# Patient Record
Sex: Male | Born: 1940
Health system: Southern US, Community
[De-identification: ages and names within clinical notes are randomized; demographics above are authoritative.]

## PROBLEM LIST (undated history)

## (undated) DIAGNOSIS — F419 Anxiety disorder, unspecified: Secondary | ICD-10-CM

## (undated) DIAGNOSIS — M4800 Spinal stenosis, site unspecified: Secondary | ICD-10-CM

## (undated) DIAGNOSIS — I10 Essential (primary) hypertension: Secondary | ICD-10-CM

## (undated) DIAGNOSIS — E785 Hyperlipidemia, unspecified: Secondary | ICD-10-CM

## (undated) DIAGNOSIS — C61 Malignant neoplasm of prostate: Secondary | ICD-10-CM

## (undated) DIAGNOSIS — I480 Paroxysmal atrial fibrillation: Secondary | ICD-10-CM

## (undated) DIAGNOSIS — I639 Cerebral infarction, unspecified: Secondary | ICD-10-CM

## (undated) DIAGNOSIS — K219 Gastro-esophageal reflux disease without esophagitis: Secondary | ICD-10-CM

## (undated) DIAGNOSIS — Z8739 Personal history of other diseases of the musculoskeletal system and connective tissue: Secondary | ICD-10-CM

## (undated) DIAGNOSIS — K635 Polyp of colon: Secondary | ICD-10-CM

## (undated) HISTORY — PX: OTHER SURGICAL HISTORY: SHX169

## (undated) HISTORY — DX: Hyperlipidemia, unspecified: E78.5

## (undated) HISTORY — DX: Cerebral infarction, unspecified: I63.9

## (undated) HISTORY — DX: Spinal stenosis, site unspecified: M48.00

## (undated) HISTORY — DX: Malignant neoplasm of prostate: C61

## (undated) HISTORY — DX: Essential (primary) hypertension: I10

## (undated) HISTORY — DX: Paroxysmal atrial fibrillation: I48.0

## (undated) HISTORY — PX: COLONOSCOPY: SHX174

## (undated) HISTORY — DX: Gastro-esophageal reflux disease without esophagitis: K21.9

---

## 1993-06-17 ENCOUNTER — Encounter: Payer: Self-pay | Admitting: Cardiology

## 2000-05-28 ENCOUNTER — Encounter: Payer: Self-pay | Admitting: *Deleted

## 2000-05-31 ENCOUNTER — Inpatient Hospital Stay (HOSPITAL_COMMUNITY): Admission: RE | Admit: 2000-05-31 | Discharge: 2000-06-04 | Payer: Self-pay | Admitting: *Deleted

## 2001-04-05 ENCOUNTER — Inpatient Hospital Stay (HOSPITAL_COMMUNITY): Admission: RE | Admit: 2001-04-05 | Discharge: 2001-04-08 | Payer: Self-pay | Admitting: *Deleted

## 2004-05-27 ENCOUNTER — Encounter: Payer: Self-pay | Admitting: Cardiology

## 2005-08-10 ENCOUNTER — Encounter (HOSPITAL_COMMUNITY): Admission: RE | Admit: 2005-08-10 | Discharge: 2005-10-10 | Payer: Self-pay | Admitting: Urology

## 2005-09-08 ENCOUNTER — Ambulatory Visit: Payer: Self-pay | Admitting: *Deleted

## 2005-09-08 ENCOUNTER — Encounter: Payer: Self-pay | Admitting: Cardiology

## 2005-10-16 HISTORY — PX: PROSTATE SURGERY: SHX751

## 2005-10-23 ENCOUNTER — Encounter (INDEPENDENT_AMBULATORY_CARE_PROVIDER_SITE_OTHER): Payer: Self-pay | Admitting: *Deleted

## 2005-10-23 ENCOUNTER — Inpatient Hospital Stay (HOSPITAL_COMMUNITY): Admission: RE | Admit: 2005-10-23 | Discharge: 2005-10-24 | Payer: Self-pay | Admitting: Urology

## 2009-04-12 ENCOUNTER — Encounter: Payer: Self-pay | Admitting: Cardiology

## 2009-09-01 ENCOUNTER — Encounter: Payer: Self-pay | Admitting: Cardiology

## 2009-09-06 ENCOUNTER — Ambulatory Visit (HOSPITAL_COMMUNITY): Admission: RE | Admit: 2009-09-06 | Discharge: 2009-09-07 | Payer: Self-pay | Admitting: Neurosurgery

## 2009-09-17 ENCOUNTER — Encounter: Payer: Self-pay | Admitting: Cardiology

## 2009-09-17 ENCOUNTER — Ambulatory Visit: Payer: Self-pay | Admitting: Cardiology

## 2009-09-17 ENCOUNTER — Encounter: Payer: Self-pay | Admitting: Physician Assistant

## 2009-09-18 ENCOUNTER — Encounter: Payer: Self-pay | Admitting: Cardiology

## 2009-09-20 ENCOUNTER — Encounter: Payer: Self-pay | Admitting: Cardiology

## 2009-09-21 ENCOUNTER — Encounter: Payer: Self-pay | Admitting: Cardiology

## 2009-10-27 DIAGNOSIS — I480 Paroxysmal atrial fibrillation: Secondary | ICD-10-CM

## 2009-10-28 ENCOUNTER — Encounter: Payer: Self-pay | Admitting: Physician Assistant

## 2009-10-28 ENCOUNTER — Ambulatory Visit: Payer: Self-pay | Admitting: Cardiology

## 2009-10-28 DIAGNOSIS — E782 Mixed hyperlipidemia: Secondary | ICD-10-CM

## 2010-02-01 ENCOUNTER — Encounter: Payer: Self-pay | Admitting: Cardiology

## 2010-04-28 ENCOUNTER — Ambulatory Visit: Payer: Self-pay | Admitting: Cardiology

## 2010-04-28 DIAGNOSIS — I10 Essential (primary) hypertension: Secondary | ICD-10-CM

## 2010-05-26 ENCOUNTER — Encounter: Payer: Self-pay | Admitting: Cardiology

## 2010-11-01 ENCOUNTER — Ambulatory Visit: Admit: 2010-11-01 | Payer: Self-pay | Admitting: Cardiology

## 2010-11-17 NOTE — Assessment & Plan Note (Signed)
Summary: 6 mo fu per july   Visit Type:  Follow-up Primary Provider:  Dr. Doreen Beam   History of Present Illness: 70 year old male presents for followup. He reports doing well, indicating better energy since his last visit, no palpitations, and no exertional chest pain. He continues to work in a [employer].  Followup labs from April 2011 showed BUN 10, creatinine 0.8, AST 14, ALT 16, cholesterol 118, HDL 45, LDL 52, triglycerides 107, potassium 4.2, TSH 1.0, hemoglobin 15.3, platelets 239.  We discussed the recent FDA recommendations regarding simvastatin dosing.  He denies any bleeding problems on Coumadin.  I reviewed his cardiac testing including echocardiogram from 2010, and prior Cardiolite from 2005.  Preventive Screening-Counseling & Management  Alcohol-Tobacco     Smoking Status: never  Current Medications (verified): 1)  Cardizem Cd 240 Mg Xr24h-Cap (Diltiazem Hcl Coated Beads) .... Take 1 Tablet By Mouth Once A Day 2)  Vasotec 20 Mg Tabs (Enalapril Maleate) .... Take 1 Tablet By Mouth Two Times A Day 3)  Multivitamins  Tabs (Multiple Vitamin) .... Take 1 Tablet By Mouth Once A Day 4)  Gabapentin 300 Mg Caps (Gabapentin) .... Take 1 Tablet By Mouth Two Times A Day 5)  Metformin Hcl 500 Mg Tabs (Metformin Hcl) .... Take 1 Tablet By Mouth Two Times A Day 6)  Simvastatin 40 Mg Tabs (Simvastatin) .... Take 1 Tablet By Mouth Once A Day 7)  Coumadin 2.5 Mg Tabs (Warfarin Sodium) .... Use As Directed 8)  Fish Oil 1000 Mg Caps (Omega-3 Fatty Acids) .... Take 2 Tablet By Mouth Once A Day 9)  Zolpidem Tartrate 10 Mg Tabs (Zolpidem Tartrate) .... Take 1 Tablet By Mouth Once A Day At Bedtime  Allergies (verified): No Known Drug Allergies  Comments:  Nurse/Medical Assistant: The patient's medication list and allergies were reviewed with the patient and were updated in the Medication and Allergy Lists.  Past History:  Social History: Last updated:  04/28/2010 Tobacco Use - No.  Alcohol Use - yes  Past Medical History: Atrial Fibrillation - paroxysmal Cerebrovascular Disease - stroke in 1994 G E R D Hyperlipidemia Hypertension Spinal stenosis Prostate cancer Diabetes Type 2  Past Surgical History: Bilateral total hip replacements Bilateral decompressive laminectomies and foraminotomies L4-L5  Family History: Brother age 63 status post MI and bypass surgery  Social History: Tobacco Use - No.  Alcohol Use - yes  Review of Systems  The patient denies anorexia, fever, weight loss, chest pain, syncope, dyspnea on exertion, peripheral edema, hemoptysis, melena, hematochezia, and severe indigestion/heartburn.         Otherwise reviewed and negative.  Vital Signs:  Patient profile:   70 year old male Height:      71 inches Weight:      194 pounds Pulse rate:   94 / minute BP sitting:   103 / 75  (left arm) Cuff size:   large  Vitals Entered By: Carlye Grippe (April 28, 2010 9:56 AM)  Physical Exam  Additional Exam:  GENERAL:  70 year old male, in no distress. HEENT: [place], AT. PERRLA, EOMI. NECK: Palpable carotid pulses, no bruits; no JVD; no thyromegaly. LUNGS: CTA bilaterally. CARDIAC: Irregularly irregular. No significant murmurs. No rubs, gallops. ABDOMEN: Soft, nontender. Intact BS. EXTREMETIES: no significant edema. MUSCULOSKELETAL: No obvious deformity. SKIN: warm, dry. no obvious rashes. NEURO: Alert and oriented. No focal deficit.    Echocardiogram  Procedure date:  09/20/2009  Findings:      Mild LVH with LVEF 55-60%. Mild  RAE, aortic annular calcification with no aortic stenosis, mitral annular calcification with mild MR, trace TR, mild pulmonary hypertension.  Nuclear Study  Procedure date:  05/23/2004  Findings:      Adenosine Cardiolite without diagnostic ST segment changes. Fixed anteroapical and inferoapical defects with normal wall motion and LVEF 55%. No ischemia.  EKG  Procedure  date:  04/28/2010  Findings:      Atrial fibrillation at 88 beats per minute, nonspecific ST changes.  Impression & Recommendations:  Problem # 1:  ATRIAL FIBRILLATION (ICD-427.31)  Paroxysmal to persistent atrial fibrillation. Symptomatically Mr. Broyles is stable without any significant palpitations. He continues on Cardizem CD and Coumadin. Noted to be in atrial fibrillation today, asymptomatic. Continue medical therapy and observation. Office visit in 6 months.  His updated medication list for this problem includes:    Coumadin 2.5 Mg Tabs (Warfarin sodium) ..... Use as directed  Orders: EKG w/ Interpretation (93000)  Problem # 2:  DYSLIPIDEMIA (ICD-272.4)  Followed by Dr. Sherril Croon with favorable LDL of 52 and normal liver function tests back in April. Based on most recent FDA recommendations, simvastatin should be decreased to 10 mg daily in light of concurrent therapy with Cardizem CD. I asked him to discuss this with Dr. Sherril Croon. Following this change, would suggest a repeat lipid profile and liver function tests over 3 months. Need to ensure that LDL is still well controlled on the reduced dose, otherwise he will need a different medication.  His updated medication list for this problem includes:    Simvastatin 40 Mg Tabs (Simvastatin) .Marland Kitchen... Take 1 tablet by mouth once a day  Problem # 3:  DM (ICD-250.00)  Followed by Dr. Sherril Croon.  His updated medication list for this problem includes:    Vasotec 20 Mg Tabs (Enalapril maleate) .Marland Kitchen... Take 1 tablet by mouth two times a day    Metformin Hcl 500 Mg Tabs (Metformin hcl) .Marland Kitchen... Take 1 tablet by mouth two times a day  Problem # 4:  ESSENTIAL HYPERTENSION, BENIGN (ICD-401.1)  Blood pressure well controlled today.  His updated medication list for this problem includes:    Cardizem Cd 240 Mg Xr24h-cap (Diltiazem hcl coated beads) .Marland Kitchen... Take 1 tablet by mouth once a day    Vasotec 20 Mg Tabs (Enalapril maleate) .Marland Kitchen... Take 1 tablet by mouth  two times a day  His updated medication list for this problem includes:    Cardizem Cd 240 Mg Xr24h-cap (Diltiazem hcl coated beads) .Marland Kitchen... Take 1 tablet by mouth once a day    Vasotec 20 Mg Tabs (Enalapril maleate) .Marland Kitchen... Take 1 tablet by mouth two times a day  Patient Instructions: 1)  Your physician wants you to follow-up in: 6 months. You will receive a reminder letter in the mail one-two months in advance. If you don't receive a letter, please call our office to schedule the follow-up appointment. 2)  Discuss Simvastatin and diltiazem doses with Dr. Sherril Croon.

## 2010-11-17 NOTE — Assessment & Plan Note (Signed)
Summary: old est pat. 2006   Visit Type:  hospital follow-up Primary Provider:  Doreen Beam, MD   History of Present Illness: 70 -year-old male, with previous history of PAF, recently referred to [place] here at [facility] for evaluation of paroxysmal atrial fibrillation with RVR, in the setting of urosepsis. We assisted in management of rate control, with increase in home dose of diltiazem. 2-D echo was obtained, indicating normal LVF with no focal motion abnormalities, mild mitral regurgitation. Of note, the patient has no known history of CAD,  has numerous cardiac risk factors, including diabetes mellitus. Also, he presented on chronic Coumadin, which continues to be followed by Dr. Sherril Croon.  Since being discharged in December, patient denies any development of tachycardia palpitations, chest pain, or shortness of breath.  He has followed up with Dr. Sherril Croon, including monitoring of his protimes, and he reports compliance with his medications.  A 12-lead EKG in our office today indicates maintenance of NSR.   Preventive Screening-Counseling & Management  Alcohol-Tobacco     Smoking Status: never  Current Medications (verified): 1)  Cardizem Cd 240 Mg Xr24h-Cap (Diltiazem Hcl Coated Beads) .... Take 1 Tablet By Mouth Once A Day 2)  Vasotec 20 Mg Tabs (Enalapril Maleate) .... Take 1 Tablet By Mouth Two Times A Day 3)  Multivitamins  Tabs (Multiple Vitamin) .... Take 1 Tablet By Mouth Once A Day 4)  Gabapentin 300 Mg Caps (Gabapentin) .... Take 1 Tablet By Mouth Two Times A Day 5)  Metformin Hcl 500 Mg Tabs (Metformin Hcl) .... Take 1 Tablet By Mouth Two Times A Day 6)  Simvastatin 40 Mg Tabs (Simvastatin) .... Take 1 Tablet By Mouth Once A Day 7)  Coumadin 2.5 Mg Tabs (Warfarin Sodium) .... Use As Directed 8)  Fish Oil 1000 Mg Caps (Omega-3 Fatty Acids) .... Take 2 Tablet By Mouth Once A Day  Allergies (verified): No Known Drug Allergies  Comments:  Nurse/Medical Assistant: The patient's  medications and allergies were reviewed with the patient and were updated in the Medication and Allergy Lists. List brought.  Social History: Smoking Status:  never  Review of Systems       No fevers, chills, hemoptysis, dysphagia, melena, hematocheezia, hematuria, rash, claudication, orthopnea, pnd, pedal edema. All other systems negative.   Vital Signs:  Patient profile:   70 year old male Height:      71 inches Weight:      200 pounds BMI:     28.00 Pulse rate:   73 / minute BP sitting:   126 / 78  (left arm) Cuff size:   large  Vitals Entered By: Carlye Grippe (October 28, 2009 1:51 PM)  Nutrition Counseling: Patient's BMI is greater than 25 and therefore counseled on weight management options.   Physical Exam  Additional Exam:  GENERAL:  70 year old male, in no distress. HEENT: [place], AT. PERRLA, EOMI. NECK: Palpable carotid pulses, no bruits; no JVD; no thyromegaly. LUNGS: CTA bilaterally. CARDIAC: RRR (S1,S2). No significant murmurs. No rubs, gallops. ABDOMEN: Soft, nontender. Intact BS. EXTREMETIES: no significant edema. MUSCULOSKELETAL: No obvious deformity. SKIN: warm, dry. no obvious rashes. NEURO: Alert and oriented. No focal deficit.    EKG  Procedure date:  10/28/2009  Findings:      NSR at 70 bpm; normal axis; nonspecific ST changes.  Impression & Recommendations:  Problem # 1:  ATRIAL FIBRILLATION (ICD-427.31) patient presents for posthospital followup, with evidence of maintenance of NSR after being treated for recurrent PAF with RVR here at  [facility] This was in the setting of urosepsis. 2-D echocardiography indicated normal LVF with no focal wall motion abnormalities, and mild MR. We adjusted his medications with titration Cardizem. Patient has elevated [place] score of 4, and has been on long-term Coumadin anticoagulation, followed by Dr. Sherril Croon.  Of note the patient does have multiple cardiac risk factors, but no documented history of CAD. He  presents with no complaint of exertional angina pectoris, and had a negative adenosine Cardiolite in August 2000.  Therefore, no further cardiac workup indicated at this point in time, and we will have patient return to the clinic to resume followup with Dr. Simona Huh in approximately 6 months.  Problem # 2:  DYSLIPIDEMIA (ICD-272.4)  The following medications were removed from the medication list:    Caduet 5-10 Mg Tabs (Amlodipine-atorvastatin) .Marland Kitchen... Take 1 tablet by mouth once a day His updated medication list for this problem includes:    Simvastatin 40 Mg Tabs (Simvastatin) .Marland Kitchen... Take 1 tablet by mouth once a day  Problem # 3:  DM (ICD-250.00) Assessment: Comment Only  Problem # 4:  CVA (ICD-434.91) Assessment: Comment Only  Other Orders: EKG w/ Interpretation (93000)  Patient Instructions: 1)  Your physician wants you to follow-up in: 6 months. You will receive a reminder letter in the mail one-two months in advance. If you don't receive a letter, please call our office to schedule the follow-up appointment. 2)  Your physician recommends that you continue on your current medications as directed. Please refer to the Current Medication list given to you today.

## 2010-11-18 NOTE — Letter (Signed)
Summary: [facility] D/C SUMMARY DR. VYAS  [facility] D/C SUMMARY DR. VYAS   Imported By: Zachary George 10/27/2009 16:25:51  _____________________________________________________________________  External Attachment:    Type:   Image     Comment:   External Document

## 2010-12-08 ENCOUNTER — Ambulatory Visit: Payer: Self-pay | Admitting: Cardiology

## 2010-12-29 ENCOUNTER — Encounter: Payer: Self-pay | Admitting: *Deleted

## 2011-01-11 ENCOUNTER — Encounter: Payer: Self-pay | Admitting: Cardiology

## 2011-01-11 ENCOUNTER — Ambulatory Visit (INDEPENDENT_AMBULATORY_CARE_PROVIDER_SITE_OTHER): Payer: Medicare PPO | Admitting: Cardiology

## 2011-01-11 DIAGNOSIS — I10 Essential (primary) hypertension: Secondary | ICD-10-CM

## 2011-01-11 DIAGNOSIS — Z79899 Other long term (current) drug therapy: Secondary | ICD-10-CM

## 2011-01-11 DIAGNOSIS — E782 Mixed hyperlipidemia: Secondary | ICD-10-CM

## 2011-01-11 DIAGNOSIS — I4891 Unspecified atrial fibrillation: Secondary | ICD-10-CM

## 2011-01-11 MED ORDER — ATORVASTATIN CALCIUM 40 MG PO TABS: 40.0000 mg | ORAL_TABLET | Freq: Every day | ORAL | Status: DC

## 2011-01-11 NOTE — Progress Notes (Signed)
Clinical Summary Nicholas Berry is a 70 y.o.male presenting for routine followup. He was seen in July 2011. We discussed changes in his simvastatin dosing at that time given concurrent use of Cardizem CD, and discussion with primary care physician. No medication changes have been made as yet.  He reports no limiting shortness of breath, no exertional chest pain. No progressive problems with palpitations.  He denies any bleeding problems on Coumadin. ECG is reviewed below.   No Known Allergies  Current outpatient prescriptions:diltiazem (CARDIZEM CD) 240 MG 24 hr capsule, Take 240 mg by mouth daily.  , Disp: , Rfl: ;  enalapril (VASOTEC) 20 MG tablet, Take one by mouth twice daily  , Disp: , Rfl: ;  gabapentin (NEURONTIN) 300 MG capsule, Take one by mouth twice daily  , Disp: , Rfl: ;  metFORMIN (GLUCOPHAGE) 500 MG tablet, Take 500 mg by mouth 2 (two) times daily with a meal.  , Disp: , Rfl:  Multiple Vitamin (MULTIVITAMIN) tablet, Take 1 tablet by mouth daily.  , Disp: , Rfl: ;  Omega-3 Fatty Acids (FISH OIL) 1000 MG CAPS, Take 2 by mouth daily  , Disp: , Rfl: ;  warfarin (COUMADIN) 2.5 MG tablet, Use as directed  , Disp: , Rfl: ;  zolpidem (AMBIEN) 10 MG tablet, Take 10 mg by mouth at bedtime as needed.  , Disp: , Rfl: ;  DISCONTD: simvastatin (ZOCOR) 40 MG tablet, Take 40 mg by mouth at bedtime.  , Disp: , Rfl:   Past Medical History  Diagnosis Date  . Atrial fibrillation     Paroxysmal  . Stroke     1994  . GERD (gastroesophageal reflux disease)   . Hyperlipidemia   . Hypertension   . Spinal stenosis   . Prostate cancer   . Type 2 diabetes mellitus     Social History Nicholas Berry reports that he has never smoked. He has never used smokeless tobacco. Nicholas Berry reports that he drinks alcohol.  Review of Systems No fevers, chills, or unusual weight change. No chest pain, progressive shortness of breath, cough, hemoptysis, or wheezing. No palpitations, dizziness, or syncope. No  dysphasia or odynophagia. Stable appetite with no abdominal pain, melena, or hematochezia. No orthopnea, PND, or lower extremity edema. No focal motor weakness, memory problems, or speech deficits. Otherwise systems reviewed and negative except as already outlined.   Physical Examination Filed Vitals:   01/11/11 0915  BP: 150/85  Pulse: 65  GENERAL:  70 year old male, in no distress. HEENT: [place], AT. PERRLA, EOMI. NECK: Palpable carotid pulses, no bruits; no JVD; no thyromegaly. LUNGS: CTA bilaterally. CARDIAC:RRR. No significant murmurs. No rubs, gallops. ABDOMEN: Soft, nontender. Intact BS. EXTREMETIES: no significant edema. MUSCULOSKELETAL: No obvious deformity. SKIN: warm, dry. no obvious rashes. NEURO: Alert and oriented. No focal deficit.   ECG Normal sinus rhythm at 67 beats per minute.   Problem List and Plan

## 2011-01-11 NOTE — Assessment & Plan Note (Signed)
Discussed sodium restriction, exercise. Continue medical therapy and followup with Dr. Sherril Croon.

## 2011-01-11 NOTE — Assessment & Plan Note (Signed)
In normal sinus rhythm today. Continue diltiazem CD and Coumadin.

## 2011-01-11 NOTE — Assessment & Plan Note (Signed)
We discussed his medications again. Will discontinue simvastatin, and initiate atorvastatin 40 mg daily. Followup fasting lipid profile and liver function tests in 3 months.

## 2011-01-11 NOTE — Patient Instructions (Signed)
Your physician wants you to follow-up in: 6 months. You will receive a reminder letter in the mail one-two months in advance. If you don't receive a letter, please call our office to schedule the follow-up appointment. Your physician recommends that you go to the [facility] for a FASTING lipid profile and liver function labs. Do not eat or drink after midnight. DO IN 3 MONTHS. Stop Zocor (simvastatin). Start Lipitor (atorvastatin) 40mg  by mouth every night.

## 2011-01-18 LAB — DIFFERENTIAL
Eosinophils Absolute: 0.2 10*3/uL (ref 0.0–0.7)
Lymphocytes Relative: 28 % (ref 12–46)
Lymphs Abs: 2.4 10*3/uL (ref 0.7–4.0)
Monocytes Relative: 8 % (ref 3–12)
Neutro Abs: 5.2 10*3/uL (ref 1.7–7.7)
Neutrophils Relative %: 61 % (ref 43–77)

## 2011-01-18 LAB — BASIC METABOLIC PANEL
BUN: 6 mg/dL (ref 6–23)
Calcium: 9.6 mg/dL (ref 8.4–10.5)
Chloride: 95 mEq/L — ABNORMAL LOW (ref 96–112)
Creatinine, Ser: 0.71 mg/dL (ref 0.4–1.5)
GFR calc Af Amer: >60 mL/min (ref >60)
GFR calc non Af Amer: >60 mL/min (ref >60)

## 2011-01-18 LAB — PROTIME-INR
INR: 2.15 — ABNORMAL HIGH (ref 0.00–1.49)
Prothrombin Time: 23.8 seconds — ABNORMAL HIGH (ref 11.6–15.2)

## 2011-01-18 LAB — CBC
Platelets: 185 10*3/uL (ref 150–400)
RBC: 5.04 MIL/uL (ref 4.22–5.81)
WBC: 8.6 10*3/uL (ref 4.0–10.5)

## 2011-01-18 LAB — ABO/RH: ABO/RH(D): A POS

## 2011-01-18 LAB — TYPE AND SCREEN
ABO/RH(D): A POS
Antibody Screen: NEGATIVE

## 2011-01-18 LAB — APTT: aPTT: 36 seconds (ref 24–37)

## 2011-01-25 ENCOUNTER — Encounter: Payer: Self-pay | Admitting: Cardiology

## 2011-02-17 ENCOUNTER — Telehealth: Payer: Self-pay | Admitting: *Deleted

## 2011-02-17 NOTE — Telephone Encounter (Signed)
Because of his concurrent use of Cardizem, I would prefer him to be on Lipitor. We would need to reduce simvastatin otherwise to a fairly low dose, and I doubt that this would provide adequate LDL control.

## 2011-02-17 NOTE — Telephone Encounter (Signed)
Pt returned call. He states he was taking simvastatin but this was changed to generic Lipitor. He states the simvastatin as only $6-8 but the atorvastatin is $45. He would like to know if he can go back on simvastatin b/c of the price.

## 2011-02-17 NOTE — Telephone Encounter (Signed)
Pt left message on voicemail asking for return call regarding medications that was changed at last office visit. He states the new med is much more expensive. He wants to know if he can go back to the previous med.  Attempted to reach pt. Left message to call back on voicemail.

## 2011-02-17 NOTE — Telephone Encounter (Signed)
Pt notified and verbalized understanding.

## 2011-03-03 NOTE — Op Note (Signed)
[place]  Patient:    Nicholas Berry, Nicholas Berry                        MRN: [id number] Proc. Date: 05/31/00 Adm. Date:  [id number] Attending:  Maryanna Shape                           Operative Report  PREOPERATIVE DIAGNOSIS:  Avascular necrosis, right hip.  POSTOPERATIVE DIAGNOSIS:  Avascular necrosis, right hip.  OPERATION:  Right total hip arthroplasty, Osteonics type, with cementing of femoral component.  ANESTHESIA:  General.  SURGEON:  Reynolds Bowl, M.D.  ASSISTANT:  Cammy Copa, M.D.  DESCRIPTION OF PROCEDURE:  Preoperatively the patient had an IV started.  Was given 2 g of Ancef.  A Foley catheter was inserted.  He was given a general anesthetic and then placed in the lateral position with an axillary roll, and all areas were well-padded.  The hip was isolated with the use of a U-drape. Then he was stabilized on the table in the [place] frame.  He was prepped from the toes to the dressing edges with DuraPrep, then draped in the usual manner.  This included the use of plastic sheets.  With the hip flexed to 90 degrees, a posterolateral incision was made and centered on the trochanter and carried down to the iliotibial band which was then split a short distance.  The fibers of the gluteus maximus were digitally divided.  The hip was internally rotated.  The external rotators were exposed. Tags were put on the piriformis and then the external rotators were divided and reflected off of the capsule laterally.  This included the quadratus and this then exposed the lesser trochanter.  The capsule was opened with two parallel incisions a little more than 2.0 cm apart, so as to form a posteriorly-attached flap.  This was tagged with #1 Vicryl.  The hip was dislocated.  The articular cartilage was rubbery and like a blister overlying the underlying necrotic bone.  By reference to the onlays, we decided to resect the femoral neck almost a  finger breadth.  This was accomplished and then the head dislocated.  Attention was then directed to the acetabulum.  The capsule was reflected.  The labrum excised.  The tissue in the pulvinar excised.  Then using the reamers, I reamed the acetabulum down to petechial bleeding bone, up to size 56.0 mm.  I then inserted an Osteonics secure fit screwless acetabular shell which was hammered home and fit very tightly.  This was placed in abduction and anteverted.  Attention was directed to the femoral shaft which was sequentially reamed up to accept a #7 cemented Eon + hip, with 127 degree neck angle.  The rasp was used.  There was a small amount of cancellus bone left in the calcar and in the trochanteric area.  A #4 sized cement plug was inserted distally.  The femoral shaft was then lavaged with pulsatile lavage and brush, following which after using trials, the #7 Eon cemented hip was cemented in place.  This included a small centralizer.  All excess cement was removed.  The hip was held steady until all the cement set.  The hole eliminator was inserted in the acetabular shell, then a #10 Omnifit cup insert series 2 was inserted, following which the C-taper head was dried and a +10 head tapped in place.  Then as with  the trials, the hip had 3.0 mm of shucking, would allow adduction and internal rotation to about 40 degrees, full extension, wide abduction.  We felt it was very stable.  The hip was lavaged with pulsatile lavage multiple times throughout these various steps.  The hip was then closed using figure-of-eight sutures of #1 Vicryl for the iliotibial band, #2-0 Vicryl for the more subcutaneous tissues.  The skin edges held in apposition with metal staples.  There was one suction Hemovac placed superior to the IT band.  IT should be noted that the capsular flap was repaired back to the base of the trochanter through bone holes, as were the external rotators.  After applying a  dressing, the patient was returned to the recovery room in good condition. The estimated blood loss was 200-300 cc.  None was replaced. DD:  05/31/00 TD:  05/31/00 Job: [id number]

## 2011-03-03 NOTE — Op Note (Signed)
[place]. [facility]  Patient:    Nicholas Berry, Nicholas Berry                      MRN: [id number] Proc. Date: 04/05/01 Adm. Date:  [id number] Attending:  Maryanna Shape                           Operative Report  PREOPERATIVE DIAGNOSES:  Aseptic necrosis, left hip.  POSTOPERATIVE DIAGNOSES:  Aseptic necrosis, left hip.  OPERATIVE PROCEDURE:  Left total hip arthroplasty.  SURGEON:  Fritzi Mandes, M.D.  ASSISTANT:  Renae Fickle D. Ollen Bowl, M.D.  ANESTHESIA:  General via endotracheal tube.  DESCRIPTION OF PROCEDURE:  The patient was given a general anesthetic by endotracheal tube.  Ancef 2 g was given IV.  A Foley catheter was put in place.  He was placed in the lateral position and supported by the Congo frame. Inguinal area was isolated with U-drape.  He was then prepped from the toes including the edges of the drape following which he was draped in the usual manner and this included the use of Ioban.  With the hip flexed to 90 degrees I made a direct posterolateral incision carried down to the iliotibial band which was split and then the gluteus muscles were divided along their plane, retracted, exposing the trochanteric bursal area which was divided and then retracted.  A Charnley retractor was put in place.  Patient had essentially no rotation and it was somewhat difficult dividing the external rotators close to their insertion but this was accomplished and tack sutures were put in place.  External rotators were dissected from the hip capsule.  Then the hip capsule was cut so as to provide a wide posterior based capsular flap for later closure.  At this point we were able to dislocate the hip and the hip surface was collapsed.  The neck was then cut approximately 13 mm in length using the saw and then the head dislocated.  The femoral shaft was progressively reamed and then rasped to accept #7 size Eon Plus hip stem.  We then paid attention to the  acetabular side.  The acetabular labrum was excised.  Then the hip reamed progressively up to size 56 mm and at that point there was petechial bleeding all the way around the acetabulum.  Fairly superiorly there was a subchondral cyst which was opened and emptied.  I then used trials, reduced the hip, and ran through range of motion and we decided for #7 Eon Plus cemented hip stem, cement plug size #4, 56 mm screwless acetabular shell with 10 degree cup insert series 2.  This allowed full flexion, rotation, abduction, hyperextension by about 10 degrees, and was very stable.  There was no laxity.  Attention was then directed back to the acetabulum and the ______ cup and the acetabulum were then hammered in place with ______ and appropriate abduction.  The femoral shaft was lavaged with pulsatile lavage, suctioned until fairly dry.  A #4 distal cement plug was put in place.  The shaft was again cleaned with pulsatile lavage and brush and suctioned dry following which the #7 Eon hip stem was cemented in place and I used a universal 15 mm distal cement spacer.  The cement was impacted and it felt to have been injected quite well. Excess cement was removed and the stem held still until the cement hardened. When all this was completed we searched  out for all bits and debris in the area, lavaged with pulsatile lavage multiple times, and dried the ______ taper head on the femoral component, then applied the Plus 0 femoral head, impacted it in place, reduced the hip.  Again, ran it through the same range of motion, had an excellent motion.  I then repaired the hip capsular flap to the base of the trochanter using drill holes.  In a similar fashion the external rotators were repaired.  This all gave an excellent closure, eliminating dead space.  The IT band was approximated using figure-of-eight sutures of #1 Vicryl.  The myofascia was approximated with 2-0 Vicryl, subcutaneous with 2-0 Vicryl, and  skin edges with metal staples.  One suction Hemovac was placed superficial to the IT band.  Dressings were applied. Patient was placed in the supine position.  The leg lengths were again checked.  They seemed to be exactly the same.  A knee immobilizer was applied. The patient was returned to recovery room in good condition.  Estimated blood loss was approximately 350 cc, none replaced. DD:  04/05/01 TD:  04/07/01 Job: [id number]

## 2011-03-03 NOTE — H&P (Signed)
[place]. [facility]  Patient:    Nicholas Berry, Nicholas Berry                        MRN: [id number] Attending:  Reynolds Bowl, M.D.                         History and Physical  HISTORY OF PRESENT ILLNESS:  Nicholas Berry is a 70 year old man with bilateral avascular necrosis of his hips.  On May 31, 2000, he underwent right total hip arthroplasty and recovered without event.  He is now admitted to proceed on with left total hip arthroplasty because of increasing pain, increasing limited motion, which all interfere with all of his activities, and this is associated with greater than half of the weightbearing surface of the femoral head collapsing.  We have fully discussed surgery and its multiple possible complications.  No guarantees have been given.  The patient would like to proceed with surgery.  ALLERGIES:  None known.  PRIMARY CARE PHYSICIAN:  Dr. Loreen Berry.  PRIOR HOSPITALIZATIONS:  September 1994, left brainstem stroke.  At the time he had right-sided paralysis and a lot of memory loss.  He feels he has recovered essentially all his motor function and just has residual memory loss for names.  MEDICATIONS:  Lipitor 10 mg a day, Vasotec 10 mg two a day, Norvasc 5 mg daily, hydrochlorothiazide 25 mg daily, aspirin one a day, but this was stopped five days ago, Ambien 5 mg at h.s.  REVIEW OF SYSTEMS:  He works as a Visual merchandiser.  He does not smoke cigarettes.  He drinks about three beers a night.  States he has no significant cardiorespiratory, GI, or GU symptoms.  PHYSICAL EXAMINATION:  VITAL SIGNS:  He is 5 feet 11 inches, 202 pounds.  The temperature is 97.5, blood pressure 120/70, pulse 96, respirations 16.  HEENT:  His extraocular movement is full.  Tympanic membranes have a good light reflex.  His teeth are repaired.  NECK:  There is no carotid bruit and no cervical palpable mass.  CHEST:  Chest sounds are clear.  He has good  volume.  CARDIAC:  Heart in regular rhythm, no murmurs heard.  ABDOMEN:  Soft, nontender, no palpable organomegaly.  Bowel sounds are present.  EXTREMITIES:  The patients peripheral pulses are intact.  There is no leg edema.  MUSCULOSKELETAL:  He walks with a limp, dropping the left shoulder.  He has some tenderness over the right hip trochanter area.  On the left side, he can actively straight leg raise about 60 degrees, but this causes pain.  The hip can further flex to about 80 degrees but is limited by pain and stiffness, and at that point there is no rotation allowed in his hip.  Neurovascular exam is intact.  LABORATORY DATA:  X-rays show avascular necrosis, left hip, greater than one-half collapse.  ADMITTING DIAGNOSIS:  Avascular necrosis of the left hip, status post right total hip arthroplasty for avascular necrosis.  PLAN:  The left total hip arthroplasty as fully discussed. DD:  04/03/01 TD:  04/03/01 Job: [id number]

## 2011-03-03 NOTE — Discharge Summary (Signed)
[place]. [facility]  Patient:    Nicholas Berry, Nicholas Berry                      MRN: [id number] Adm. Date:  [id number] Disc. Date: [id number] Attending:  Maryanna Shape                           Discharge Summary  ADMITTING DIAGNOSIS:  Avascular necrosis, left hip.  DISCHARGE DIAGNOSIS:  Avascular necrosis, left hip.  PROCEDURE:  April 05, 2001, left total hip arthroplasty with cementing of femoral component.  HISTORY OF PRESENT ILLNESS:  For history and physical, see that dictated on admission.  HOSPITAL COURSE:  On the day of admission, patient was taken to the operating room, where he underwent left total hip arthroplasty as detailed in the operative note.  The patients postoperative course has been benign.  He was on pre- and postoperative antibiotic prophylaxis.  He was begun on Coumadin immediately postoperative.  He began ambulation early and is having no apparent problems. Partial weightbearing with crutches.  He has good leg control.  He is on Coumadin.  His INR is 1.9 with a goal of being 2.0, and his last hemoglobin was 11.9.  At this point, the patient is to be discharged to home, be on Coumadin protocol, Tylox for pain, home physical therapy, sutures replaced with tape at 10 days, see me in the office approximately three weeks postoperative, call with any concerns.  LABORATORY DATA:  Some of the laboratory data obtained while here: Preoperatively the hemoglobin was 14.4, fourth day postoperative 11.9.  INR on discharge 1.9.  Electrolytes normal.  Urinalysis normal.  Chest x-ray:  Right and left lower lobe nodular densities, probably prominent nipple shadows. DD:  04/08/01 TD:  04/08/01 Job: [id number]

## 2011-03-03 NOTE — H&P (Signed)
NAME:  Nicholas Berry, Nicholas Berry NO.:  [id number]   MEDICAL RECORD NO.:  [id number]          PATIENT TYPE:  INP   LOCATION:  0011                         FACILITY:  [facility]   PHYSICIAN:  Valetta Fuller, M.D.  DATE OF BIRTH:  07-26-1941   DATE OF ADMISSION:  10/23/2005  DATE OF DISCHARGE:                                HISTORY & PHYSICAL   ADMISSION DIAGNOSIS:  Adenocarcinoma of the prostate.   HISTORY OF PRESENT ILLNESS:  Nicholas Berry is a 70 year old male. The  patient was initially noted  to have an elevated PSA of 16 this past summer.  He was felt to have probable prostatitis and after a 2-week course of  antibiotics, his PSA did improve significantly to approximately 7. The  patient did have some evidence of ongoing prostatitis and we treated him  with additional antibiotics. His urine culture became negative but his PSA  continued to be modestly elevated in the 7 to 9 range. The patient underwent  ultrasound biopsy which revealed a relatively small prostate. The patient  had bilateral adenocarcinoma of the prostate with a minute focus of Gleason  3+3=6 prostate cancer on the right and on the left a more extensive 3+ 4=7  tumor involving 80% of the biopsy material. CT and bone scan were negative.  A Prostascint scan did show some increased activity in the prostate gland  and also some activity in the mid abdomen which was felt to be probable  bowel activity. We cannot rule out the possibility of metastatic disease but  did not want to withhold possible definitive curative therapy for this  patient based on this Prostascint scan. The patient and his wife underwent  extensive counseling with regard to treatment options. He elected finally to  proceed with robotic assisted laparoscopic radical retropubic prostatectomy  with bilateral pelvic lymph node dissection. We felt that he would be a  candidate for unilateral right-sided nerve-sparing and more extensive taking  of  the nerve on the left side given his pathology report. Again the patient  appeared to understand the advantages and disadvantages of this surgical  approach. He has done the preparatory steps. The patient has also had  cardiac reassessment. He does have a history of atrial fibrillation and has  been on Coumadin. Previous cardiac evaluation did not reveal any obvious  reversible ischemia. The patient did have preoperative clearance and we did  give the okay to discontinue Coumadin. The patient is without other urologic  symptoms.   PAST MEDICAL HISTORY:  Notable for atrial fibrillation. He has been recently  in sinus rhythm. A 2-D echo showed fairly good left ventricular function.   CURRENT MEDICATIONS:  Vasotec, hydrochlorothiazide, Caduet, Coumadin on hold  as well as some supplements and Cardizem.   ALLERGIES:  The patient has no drug allergies.   SURGICAL HISTORY:  Notable for hip replacement x2.   SOCIAL HISTORY:  The patient is a nonsmoker and social drinker.   FAMILY HISTORY:  Notable for diabetes and heart disease.   REVIEW OF SYSTEMS:  Really otherwise unremarkable other than some very  minimal obstructive  voiding complaints.   PHYSICAL EXAMINATION:  GENERAL:  He is a well-developed, well-nourished  male. His current weight is approximately 213 pounds.  VITAL SIGNS:  He is afebrile with a blood pressure of 124/82 and a pulse 68.  Respiratory rate is 16.  NECK:  Shows no JVD.  LUNGS:  Clear to auscultation.  HEART:  Regular rate and rhythm.  ABDOMEN:  Soft and nontender without palpable masses, tenderness or  hepatosplenomegaly.  EXTREMITIES:  Show no obvious edema.  EXTERNAL GENITALIA:  Reveals a normal penis, scrotum, testes adnexal  structures and a 1+ prostate without worrisome features.   LABORATORY DATA:  Hemoglobin and BMET are all within normal limits.   ASSESSMENT:  Clinical stage T1C adenocarcinoma of the prostate. The patient  is to undergo hopefully a  robotic assisted laparoscopic radical retropubic  prostatectomy with bilateral lymph node dissection. He will hopefully be  admitted for routine postoperative care.           ______________________________  Valetta Fuller, M.D.  Electronically Signed     DSG/MEDQ  D:  10/23/2005  T:  10/23/2005  Job:  [id number]

## 2011-03-03 NOTE — Discharge Summary (Signed)
[place]. [facility]  Patient:    Nicholas Berry, Nicholas Berry                        MRN: [id number] Adm. Date:  [id number] Disc. Date: [id number] Attending:  Maryanna Shape CC:         Dr. Chase Picket   Discharge Summary  ADMITTING DIAGNOSES: 1.  Bilateral avascular necroses of hips, right greater than left. 2.  Hypertension. 3.  Hypercholesterolemia.  OPERATIVE PROCEDURE: August 16th - right total hip osteoplasty, Osteonics type with cementing of femoral component.  DISCHARGE DIAGNOSES: 1.  Bilateral avascular necrosis of hips, right greater than left. 2.  Hypertension. 3.  Hypercholesterolemia.  HISTORY AND PHYSICAL: See that dictated on admission.  HOSPITAL COURSE: On the date of surgery the patient was given Ancef as a prophylaxis and was started on Coumadin as prophylaxis. His operative procedure was carried out as detailed in the operative note without any known complications. The first day postoperative his hemoglobin was 11.8. His Foley was removed. His drain was removed. He could set his quads well and move his ankles well. He was begun about 15% weightbearing. Since then he has had an entirely benign course and is ambulating well. His INR is 2. Hematocrit 33. He has a good understanding of total hip precautions and he is ready for discharge. Today we will remove one of every three staples and replace with Steri-Strips.  DISCHARGE INSTRUCTIONS: I will have him continue taking ferrous sulfate one a day until his previously obtained bottle is used up. He will take Vicodin for pain and then be on his usual medications of Lipitor, Vasotec, Norvasc, hydrochlorothiazide.  We anticipate him being on Coumadin for a total of four weeks. Following that he can resume his usual aspirin therapy. I plan on seeing him in the office when he is approximately ten days postoperative.  LABORATORY DATA: Obtained during this hospitalization; hemoglobin  15 preoperatively. Electrolytes normal. Liver enzymes normal except for a total bilirubin elevated to 1.7. The last hemoglobin obtained was 11.5. DD:  06/04/00 TD:  06/04/00 Job: [id number]

## 2011-03-03 NOTE — Op Note (Signed)
NAME:  Nicholas Berry, Nicholas Berry NO.:  [id number]   MEDICAL RECORD NO.:  [id number]          PATIENT TYPE:  INP   LOCATION:  1401                         FACILITY:  [facility]   PHYSICIAN:  Valetta Fuller, M.D.  DATE OF BIRTH:  11-May-1941   DATE OF PROCEDURE:  10/23/2005  DATE OF DISCHARGE:  10/24/2005                                 OPERATIVE REPORT   PREOPERATIVE DIAGNOSIS:  Clinical stage T1C adenocarcinoma of the prostate.   POSTOPERATIVE DIAGNOSIS:  Clinical stage T1C adenocarcinoma of the prostate.   PROCEDURE PERFORMED:  1.  Robotic assisted laparoscopic radical prostatectomy.  2.  Bilateral pelvic lymphadenectomy.   SURGEON:  Dr. Barron Alvine.   ASSISTANT:  Dr. Heloise Purpura.   ANESTHESIA:  General.   COMPLICATIONS:  None.   ANESTHESIA:  General.   INDICATIONS:  Nicholas Berry is a 70 year old male who was recently diagnosed  with clinical stage T1C adenocarcinoma of the prostate. The patient was  noted to have an elevated PSA of 16 this past summer. He had probable  prostatitis and with a two week course of antibiotics his PSA improved to  7.5. When we saw him, we had evidence of potentially ongoing prostatic  inflammation but despite recurrent antibiotic therapy, his PSA remained  elevated in the 7-9 range. A urine culture was negative, we ended up doing  biopsies. These were positive. The patient ended up having right-sided  prostate cancer with a Gleason score of 3+3=6 involving less than 5% of the  material and more problematically left-sided biopsies that had a Gleason  3+4=7 tumor involving 80% of the submitted material. The patient did undergo  CT and bone scan because of his PSA close t0 10 and larger volume disease.  There was no evidence of metastatic disease. The patient also had a  Prostascint scan which showed some activity in the prostate gland. There was  also some mid gut activity that was thought to be secondary to bowel, but  they could not  rule out retroperitoneal adenopathy. We elected to proceed  with definitive therapy hoping that he would have organ-confined disease,  but he understands that he is at very high risk for having extracapsular  extent positive margins. The patient was also felt to be a candidate only  for unilateral nerve-sparing given his situation. We felt that the nerve  could be potentially spared safely on the right side but that we would do  with wider excision on the left. Again the patient underwent extensive  counseling with regard to the treatment options as well as the benefits and  risks of surgery and the potential complications of robotic prostatectomy.  Much of that was discussed and outlined in my note from our office. He  presents now for the procedure.   TECHNIQUE AND FINDINGS:  The patient was brought to the operating room where  he had successful induction of general anesthetic. The patient was given  preoperative antibiotics and had received a preparation. The patient was  placed in dorsolithotomy position. Great care was taken to make sure that  the extremities were properly padded.  He was then secured to the table so  that he could be placed in a head down position. He was then prepped and  draped in the normal sterile manner. The initial site was selected 18 cm  above the pubic symphysis and just to the left of the umbilicus for  placement of the camera port. This 12 mm trocar was placed using a standard  open Hassan technique. The peritoneum was able to be easily established. A  Foley catheter had previously been placed. The remaining ports were then  placed after the abdomen was inspected. We placed 8 mm robotic ports 16 cm  from the pubic symphysis and 10 cm lateral to the camera port. An additional  8 mm robotic port was then placed in the extreme left lower quadrant. A 5 mm  port was placed to the right of the camera port and an additional 12 mm  laparoscopic assist port was  placed in the right lateral abdominal wall.   The surgical cart was then brought in and docked. Hook cautery was used to  reflect the bladder posteriorly, allowing entry into the space of Retzius  and dissection of the retropubic space. The endopelvic fascia was then  incised. Levator fibers were swept away laterally off the prostate and the  apex was identified. The dorsal vein complex was divided with an ETS staple  device down to the anterior surface of the urethra. With the completion of  this, attention was turned to the bladder neck dissection which was done  after identification with the aid of the Foley catheter and manipulation.  Once the bladder neck was divided anteriorly, the Foley catheter was removed  and the bladder neck dissection was completed circumferentially.  This was  then taken down to the vas deferens and seminal vesicles. Each of the  structures were individually dissected. After a complete dissection of these  structures to identify the space between Denonvilliers fascia that posterior  plane was easily established and allowed for nice isolation and  identification of the vascular pedicles. The vascular pedicles were taken  down with Hem-o-Lok clips and then scissors were used to incise this .  Again, we elected to perform unilateral nerve sparing. For that reason on  the right of the prostate, we used scissors to incise the lateral prostatic  fascia and allow the neurovascular bundle to be swept down laterally. Again  in taking the vascular pedicles, great care was taken on the right side to  spare the nerve and on the left we went much more laterally taking the  neurovascular bundle. The urethra was then sharply divided and the prostatic  specimen was removed and placed up in the abdomen.   The pelvis was then copiously irrigated. There was no evidence of rectal  injury based on injection of air into a rectal Foley. Bilateral pelvic lymph node dissection was  then performed. The tissue taken out was essentially the  lymph node packets within the obturator fossa between the external iliac  vein, the obturator nerve and Cooper's ligament. Hemoclips were used and  both specimens were removed and sent separately.   Attention was then turned to reconstruction. A 2-0 Vicryl holding stitch was  placed at the posterior bladder neck and the urethra at the 6 o'clock  position. A running anastomosis was then performed with 2-0 Monocryl double-  armed anastomotic suture performing a 360 degrees running anastomosis. A new  Foley catheter was inserted without difficulty. Irrigation showed a  watertight  tension-free anastomosis. The 19-French Harrison Mons drain was then  passed through one of the robotic ports and positioned within the pelvis.  The car was then undocked. An EndoCatch retrieval bag was used to remove the  prostate specimen after making the camera port incision slightly larger.  That incision was then closed with a #0 Vicryl suture. A Vicryl suture was  also placed through the 12 mm port site with the aid of a suture passer for  port site closure. All of the ports were removed under direct vision.  __________ were closed with clips. Foley catheter was left to gravity  drainage. All sponge and needle counts were correct and the patient was  brought to recovery room in stable condition. There did not appear to be any  obvious complications or problems.           ______________________________  Valetta Fuller, M.D.  Electronically Signed     DSG/MEDQ  D:  10/24/2005  T:  10/25/2005  Job:  [id number]

## 2011-06-05 ENCOUNTER — Other Ambulatory Visit: Payer: Self-pay | Admitting: *Deleted

## 2011-06-05 ENCOUNTER — Encounter: Payer: Self-pay | Admitting: *Deleted

## 2011-06-05 DIAGNOSIS — E782 Mixed hyperlipidemia: Secondary | ICD-10-CM

## 2011-06-05 DIAGNOSIS — Z79899 Other long term (current) drug therapy: Secondary | ICD-10-CM

## 2011-06-05 NOTE — Progress Notes (Signed)
  Letter mailed to pt regarding FLP/LFT lab work requested for mid June but not completed at this time.

## 2011-07-26 ENCOUNTER — Encounter: Payer: Self-pay | Admitting: Cardiology

## 2011-07-26 ENCOUNTER — Ambulatory Visit (INDEPENDENT_AMBULATORY_CARE_PROVIDER_SITE_OTHER): Payer: Medicare PPO | Admitting: Cardiology

## 2011-07-26 VITALS — BP 144/79 | HR 59 | Ht 71.0 in | Wt 204.8 lb

## 2011-07-26 DIAGNOSIS — E782 Mixed hyperlipidemia: Secondary | ICD-10-CM

## 2011-07-26 DIAGNOSIS — I4891 Unspecified atrial fibrillation: Secondary | ICD-10-CM

## 2011-07-26 DIAGNOSIS — I10 Essential (primary) hypertension: Secondary | ICD-10-CM

## 2011-07-26 NOTE — Assessment & Plan Note (Signed)
Symptomatically well controlled, in sinus rhythm today. Continue current regimen including Coumadin.

## 2011-07-26 NOTE — Assessment & Plan Note (Signed)
Discussed sodium restriction and exercise. Continue medical therapy.

## 2011-07-26 NOTE — Progress Notes (Signed)
Clinical Summary Nicholas Berry is a 70 y.o.male presenting for followup. He was seen in March. Reports no significant palpitations or chest pain. No bleeding problems on Coumadin. 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  Recent lab work from August showed BUN 14, creatinine 0.7, AST 23, ALT 24, triglycerides 143, cholesterol 140, HDL 42, LDL 69, potassium 4.4, TSH 1.6, INR 2.0, hemoglobin 16.6, platelets 153. We discussed his lipids today.  ECG is reviewed below.   No Known Allergies  Medication list reviewed.  Past Medical History  Diagnosis Date  . Atrial fibrillation     Paroxysmal  . Stroke     1994  . GERD (gastroesophageal reflux disease)   . Hyperlipidemia   . Hypertension   . Spinal stenosis   . Prostate cancer   . Type 2 diabetes mellitus     Past Surgical History  Procedure Date  . Bilateral total hip replacements   . Bilateral decompressive laminectomies & foraminotomies l4-l5     Family History  Problem Relation Age of Onset  . Heart attack Brother 81    CABG    Social History Nicholas Berry reports that he has never smoked. He has never used smokeless tobacco. Nicholas Berry reports that he drinks alcohol.  Review of Systems Negative except as outlined.  Physical Examination Filed Vitals:   07/26/11 0942  BP: 144/79  Pulse: 59    GENERAL: 70 year old male, in no distress.  HEENT: [place], AT. PERRLA, EOMI.  NECK: Palpable carotid pulses, no bruits; no JVD; no thyromegaly.  LUNGS: CTA bilaterally.  CARDIAC:RRR. No significant murmurs. No rubs, gallops.  ABDOMEN: Soft, nontender. Intact BS.  EXTREMETIES: no significant edema.  MUSCULOSKELETAL: No obvious deformity.  SKIN: warm, dry. no obvious rashes.  NEURO: Alert and oriented. No focal deficit.   ECG Sinus bradycardia at 58.   Problem List and Plan

## 2011-07-26 NOTE — Assessment & Plan Note (Signed)
LDL well controlled

## 2011-07-26 NOTE — Patient Instructions (Signed)
Your physician wants you to follow-up in: 6 months. You will receive a reminder letter in the mail one-two months in advance. If you don't receive a letter, please call our office to schedule the follow-up appointment. Your physician recommends that you continue on your current medications as directed. Please refer to the Current Medication list given to you today. 

## 2011-08-11 ENCOUNTER — Other Ambulatory Visit: Payer: Self-pay | Admitting: *Deleted

## 2011-08-11 DIAGNOSIS — E782 Mixed hyperlipidemia: Secondary | ICD-10-CM

## 2011-08-11 MED ORDER — ATORVASTATIN CALCIUM 40 MG PO TABS: 40.0000 mg | ORAL_TABLET | Freq: Every day | ORAL | Status: DC

## 2012-01-05 ENCOUNTER — Other Ambulatory Visit (INDEPENDENT_AMBULATORY_CARE_PROVIDER_SITE_OTHER): Payer: Self-pay | Admitting: General Surgery

## 2012-01-05 ENCOUNTER — Encounter (INDEPENDENT_AMBULATORY_CARE_PROVIDER_SITE_OTHER): Payer: Self-pay

## 2012-01-05 ENCOUNTER — Ambulatory Visit (INDEPENDENT_AMBULATORY_CARE_PROVIDER_SITE_OTHER): Payer: Medicare Other | Admitting: General Surgery

## 2012-01-05 ENCOUNTER — Encounter (INDEPENDENT_AMBULATORY_CARE_PROVIDER_SITE_OTHER): Payer: Self-pay | Admitting: General Surgery

## 2012-01-05 DIAGNOSIS — K8020 Calculus of gallbladder without cholecystitis without obstruction: Secondary | ICD-10-CM | POA: Insufficient documentation

## 2012-01-05 DIAGNOSIS — K769 Liver disease, unspecified: Secondary | ICD-10-CM

## 2012-01-05 NOTE — Patient Instructions (Signed)
Come back to see me if starting to have nausea with food or Right upper quadrant or upper abdominal pain.

## 2012-01-05 NOTE — Progress Notes (Signed)
Chief Complaint  Patient presents with  . Pre-op Exam    eval gallstones    HISTORY: Patient is a 71 year old male who presents with incidentally noted gallstones on CT for frequent urinary tract infections.  He has a history of prostate cancer. He denies epigastric pain, right upper quadrant pain, nausea, bloating. He does have some mild reflux that is corrected with Prilosec. He does not know of any family history of gallbladder disease. She sought a surgical referral after finding out that he had gallstones.  Past Medical History  Diagnosis Date  . Atrial fibrillation     Paroxysmal  . Stroke     1994  . GERD (gastroesophageal reflux disease)   . Hyperlipidemia   . Hypertension   . Spinal stenosis   . Prostate cancer   . Type 2 diabetes mellitus     Past Surgical History  Procedure Date  . Bilateral total hip replacements   . Bilateral decompressive laminectomies & foraminotomies l4-l5   . Prostate surgery 2007    cancer    Current Outpatient Prescriptions  Medication Sig Dispense Refill  . atorvastatin (LIPITOR) 40 MG tablet Take 1 tablet (40 mg total) by mouth at bedtime.  30 tablet  6  . diltiazem (CARDIZEM CD) 240 MG 24 hr capsule Take 240 mg by mouth daily.        . enalapril (VASOTEC) 20 MG tablet Take one by mouth twice daily        . gabapentin (NEURONTIN) 300 MG capsule Take one by mouth twice daily        . Multiple Vitamin (MULTIVITAMIN) tablet Take 1 tablet by mouth 2 (two) times daily.       . Omega-3 Fatty Acids (FISH OIL) 1000 MG CAPS 2 (two) times daily.       Marland Kitchen omeprazole (PRILOSEC) 20 MG capsule Take 20 mg by mouth daily.        . simvastatin (ZOCOR) 20 MG tablet Take 20 mg by mouth every evening.      . warfarin (COUMADIN) 2.5 MG tablet Use as directed by Dr. Sherril Croon.      . zolpidem (AMBIEN) 10 MG tablet Take 10 mg by mouth at bedtime as needed.           No Known Allergies   Family History  Problem Relation Age of Onset  . Heart attack Brother  51    CABG  . Heart disease Mother   . Heart disease Father      History   Social History  . Marital Status: Widowed    Spouse Name: N/A    Number of Children: N/A  . Years of Education: N/A   Social History Main Topics  . Smoking status: Never Smoker   . Smokeless tobacco: Never Used  . Alcohol Use: 2.4 oz/week    4 Cans of beer per week     Reports occasional  . Drug Use: No  . Sexually Active: None   Other Topics Concern  . None   Social History Narrative  . None     REVIEW OF SYSTEMS - PERTINENT POSITIVES ONLY: 12 point review of systems negative other than HPI and PMH   EXAM: Filed Vitals:   01/05/12 0901  BP: 145/82  Pulse: 70  Temp: 97.4 F (36.3 C)    Gen:  No acute distress.  Well nourished and well groomed.   Neurological: Alert and oriented to person, place, and time. Coordination normal.  Head: Normocephalic and  atraumatic.  Eyes: Conjunctivae are normal. Pupils are equal, round, and reactive to light. No scleral icterus.  Neck: Normal range of motion. Neck supple. No tracheal deviation or thyromegaly present.  Cardiovascular: Normal rate, regular rhythm, normal heart sounds and intact distal pulses.  Exam reveals no gallop and no friction rub.  No murmur heard. Respiratory: Effort normal.  No respiratory distress. No chest wall tenderness. Breath sounds normal.  No wheezes, rales or rhonchi.  GI: Soft. Bowel sounds are normal. The abdomen is soft and nontender.  There is no rebound and no guarding.  Musculoskeletal: Normal range of motion. Extremities are nontender.  Lymphadenopathy: No cervical, preauricular, postauricular or axillary adenopathy is present Skin: Skin is warm and dry. No rash noted. No diaphoresis. No erythema. No pallor. No clubbing, cyanosis, or edema.   Psychiatric: Normal mood and affect. Behavior is normal. Judgment and thought content normal.    LABORATORY RESULTS: Available labs are reviewed    RADIOLOGY RESULTS: See  E-Chart or I-Site for most recent results.  Images and reports are reviewed.    ASSESSMENT AND PLAN: Cholelithiasis Patient has incidentally noted gallstones on a CT scan for another indication. He is currently asymptomatic. At this point I would defer cholecystectomy as it provides risk to the patient without any benefit. I reviewed the anatomy of the gallbladder and symptoms of gallbladder disease. I advised the patient that if he starts to develop upper abdominal pain or right upper quadrant pain to give [place] a call. I also discussed nausea after eating. He was advised that if he develops fever, chills, jaundice, and acute right upper quadrant pain that this would require the emergency room and not to our office. His risk is also higher due to his anticoagulation therapy. He is going to call if he develops symptoms and he is in agreement with this plan      Maudry Diego MD Surgical Oncology, General and Endocrine Surgery [facility]      Visit Diagnoses: 1. Cholelithiasis     Primary Care Physician: Ignatius Specking., MD, MD

## 2012-01-05 NOTE — Assessment & Plan Note (Signed)
Patient has incidentally noted gallstones on a CT scan for another indication. He is currently asymptomatic. At this point I would defer cholecystectomy as it provides risk to the patient without any benefit. I reviewed the anatomy of the gallbladder and symptoms of gallbladder disease. I advised the patient that if he starts to develop upper abdominal pain or right upper quadrant pain to give [place] a call. I also discussed nausea after eating. He was advised that if he develops fever, chills, jaundice, and acute right upper quadrant pain that this would require the emergency room and not to our office. His risk is also higher due to his anticoagulation therapy. He is going to call if he develops symptoms and he is in agreement with this plan

## 2012-01-25 ENCOUNTER — Encounter: Payer: Self-pay | Admitting: Cardiology

## 2012-01-25 ENCOUNTER — Ambulatory Visit (INDEPENDENT_AMBULATORY_CARE_PROVIDER_SITE_OTHER): Payer: Self-pay | Admitting: Cardiology

## 2012-01-25 VITALS — BP 129/87 | HR 89 | Ht 71.0 in | Wt 197.0 lb

## 2012-01-25 DIAGNOSIS — I4891 Unspecified atrial fibrillation: Secondary | ICD-10-CM

## 2012-01-25 DIAGNOSIS — I10 Essential (primary) hypertension: Secondary | ICD-10-CM

## 2012-01-25 NOTE — Progress Notes (Signed)
   Clinical Summary Mr. Docken is a 71 y.o.male presenting for followup. He was seen in October 2012. He reports no significant palpitations, no chest pain or unusual breathlessness.  He indicates compliance with his medications, no bleeding problems on Coumadin.  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  Followup ECG is noted below.  No Known Allergies  Current Outpatient Prescriptions  Medication Sig Dispense Refill  . atorvastatin (LIPITOR) 40 MG tablet Take 1 tablet (40 mg total) by mouth at bedtime.  30 tablet  6  . diltiazem (CARDIZEM CD) 240 MG 24 hr capsule Take 240 mg by mouth daily.        . enalapril (VASOTEC) 20 MG tablet Take one by mouth twice daily        . gabapentin (NEURONTIN) 300 MG capsule Take one by mouth twice daily        . Multiple Vitamin (MULTIVITAMIN) tablet Take 1 tablet by mouth 2 (two) times daily.       . Omega-3 Fatty Acids (FISH OIL) 1000 MG CAPS Take 2 capsules by mouth daily.       Marland Kitchen warfarin (COUMADIN) 2.5 MG tablet Use as directed by Dr. Sherril Croon.      . zolpidem (AMBIEN) 10 MG tablet Take 10 mg by mouth at bedtime as needed.        Marland Kitchen DISCONTD: metFORMIN (GLUCOPHAGE) 500 MG tablet Take 500 mg by mouth every morning.         Past Medical History  Diagnosis Date  . Atrial fibrillation     Paroxysmal  . Stroke     1994  . GERD (gastroesophageal reflux disease)   . Hyperlipidemia   . Hypertension   . Spinal stenosis   . Prostate cancer   . Type 2 diabetes mellitus     Social History Mr. Faulkner reports that he has never smoked. He has never used smokeless tobacco. Mr. Scheel reports that he drinks about 2.4 ounces of alcohol per week.  Review of Systems No orthopnea or PND. Occasionally has mild ankle edema at the end of the day. Stable appetite. Otherwise reviewed and negative.  Physical Examination Filed Vitals:   01/25/12 0813  BP: 129/87  Pulse: 89   Overweight male in no acute  distress. HEENT: Conjunctiva and lids normal, oropharynx clear. Neck: Supple, no elevated JVP or carotid bruits, no thyromegaly. Lungs: Clear to auscultation, nonlabored breathing at rest. Cardiac: Irregularly irregular, no S3 or significant systolic murmur, no pericardial rub. Abdomen: Soft, nontender, bowel sounds present, no guarding or rebound. Extremities: No pitting edema, distal pulses 2+. Skin: Warm and dry. Musculoskeletal: No kyphosis. Neuropsychiatric: Alert and oriented x3, affect grossly appropriate.   ECG Atrial fibrillation, nonspecific T wave changes.   Problem List and Plan

## 2012-01-25 NOTE — Patient Instructions (Signed)
Continue all current medications. Your physician wants you to follow up in: 6 months.  You will receive a reminder letter in the mail one-two months in advance.  If you don't receive a letter, please call our office to schedule the follow up appointment   

## 2012-01-25 NOTE — Assessment & Plan Note (Signed)
Chronic, asymptomatic and rate controlled. No bleeding problems on Coumadin. No changes made today.

## 2012-01-25 NOTE — Assessment & Plan Note (Signed)
Blood pressure is reasonably well controlled today. 

## 2012-04-04 ENCOUNTER — Encounter (INDEPENDENT_AMBULATORY_CARE_PROVIDER_SITE_OTHER): Payer: Self-pay

## 2012-04-09 ENCOUNTER — Encounter (INDEPENDENT_AMBULATORY_CARE_PROVIDER_SITE_OTHER): Payer: Self-pay

## 2012-08-21 ENCOUNTER — Ambulatory Visit (INDEPENDENT_AMBULATORY_CARE_PROVIDER_SITE_OTHER): Payer: Medicare Other | Admitting: Cardiology

## 2012-08-21 ENCOUNTER — Encounter: Payer: Self-pay | Admitting: Cardiology

## 2012-08-21 VITALS — BP 162/80 | HR 76 | Ht 71.0 in | Wt 194.0 lb

## 2012-08-21 DIAGNOSIS — I4891 Unspecified atrial fibrillation: Secondary | ICD-10-CM

## 2012-08-21 DIAGNOSIS — I10 Essential (primary) hypertension: Secondary | ICD-10-CM

## 2012-08-21 NOTE — Patient Instructions (Addendum)

## 2012-08-21 NOTE — Assessment & Plan Note (Signed)
In sinus rhythm today by exam. Continue current medications including diltiazem CD and Coumadin. Followup arranged.

## 2012-08-21 NOTE — Progress Notes (Signed)
   Clinical Summary [name] is a 71 y.o.male presenting for followup. He was seen in April of this year. No change in symptoms, no major palpitations. He continues to work full time with [employer], [employer].  Coumadin is followed by Dr. Sherril Croon. He reports no spontaneous bleeding problems. We did discuss some of the other agents available for anticoagulation.  He is in sinus rhythm on examination today.  No Known Allergies  Current Outpatient Prescriptions  Medication Sig Dispense Refill  . atorvastatin (LIPITOR) 40 MG tablet Take 1 tablet (40 mg total) by mouth at bedtime.  30 tablet  6  . diltiazem (CARDIZEM CD) 240 MG 24 hr capsule Take 240 mg by mouth daily.        . enalapril (VASOTEC) 20 MG tablet Take one by mouth twice daily        . gabapentin (NEURONTIN) 300 MG capsule Take one by mouth twice daily        . Multiple Vitamin (MULTIVITAMIN) tablet Take 1 tablet by mouth 2 (two) times daily.       . Omega-3 Fatty Acids (FISH OIL) 1000 MG CAPS Take 2 capsules by mouth daily.       Marland Kitchen warfarin (COUMADIN) 2.5 MG tablet Use as directed by Dr. Sherril Croon.      . zolpidem (AMBIEN) 10 MG tablet Take 10 mg by mouth at bedtime as needed.        . [DISCONTINUED] metFORMIN (GLUCOPHAGE) 500 MG tablet Take 500 mg by mouth every morning.         Past Medical History  Diagnosis Date  . Atrial fibrillation     Paroxysmal  . Stroke     1994  . GERD (gastroesophageal reflux disease)   . Hyperlipidemia   . Hypertension   . Spinal stenosis   . Prostate cancer   . Type 2 diabetes mellitus     Social History Nicholas Berry reports that he has never smoked. He has never used smokeless tobacco. Nicholas Berry reports that he drinks about 2.4 ounces of alcohol per week.  Review of Systems Negative except as outlined.  Physical Examination Filed Vitals:   08/21/12 0850  BP: 162/80  Pulse: 76   Filed Weights   08/21/12 0850  Weight: 194 lb (87.998 kg)   No acute distress. HEENT:  Conjunctiva and lids normal, oropharynx clear.  Neck: Supple, no elevated JVP or carotid bruits, no thyromegaly.  Lungs: Clear to auscultation, nonlabored breathing at rest.  Cardiac: Regular rate and rhythm, no S3 or significant systolic murmur, no pericardial rub.  Abdomen: Soft, nontender, bowel sounds present, no guarding or rebound.  Extremities: No pitting edema, distal pulses 2+.    Problem List and Plan   ATRIAL FIBRILLATION In sinus rhythm today by exam. Continue current medications including diltiazem CD and Coumadin. Followup arranged.  ESSENTIAL HYPERTENSION, BENIGN Blood pressure elevated. He reports compliance with his medications. Recommended followup with Dr. Sherril Croon.    Jonelle Sidle, M.D., F.A.C.C.

## 2012-08-21 NOTE — Assessment & Plan Note (Signed)
Blood pressure elevated. He reports compliance with his medications. Recommended followup with Dr. Sherril Croon.

## 2013-02-19 ENCOUNTER — Encounter: Payer: Self-pay | Admitting: Cardiology

## 2013-02-19 ENCOUNTER — Ambulatory Visit (INDEPENDENT_AMBULATORY_CARE_PROVIDER_SITE_OTHER): Payer: Medicare Other | Admitting: Cardiology

## 2013-02-19 VITALS — BP 119/72 | HR 71 | Ht 71.0 in | Wt 191.0 lb

## 2013-02-19 DIAGNOSIS — I10 Essential (primary) hypertension: Secondary | ICD-10-CM

## 2013-02-19 DIAGNOSIS — I4891 Unspecified atrial fibrillation: Secondary | ICD-10-CM

## 2013-02-19 NOTE — Assessment & Plan Note (Signed)
Blood pressure is normal today. 

## 2013-02-19 NOTE — Progress Notes (Signed)
   Clinical Summary Nicholas Berry is a 72 y.o.male last seen in November 2013. Has been doing well, no chest pain or palpitations. 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  ECG today shows normal sinus rhythm. No significant change in his medications, no reported bleeding problems on Coumadin.   No Known Allergies  Current Outpatient Prescriptions  Medication Sig Dispense Refill  . atorvastatin (LIPITOR) 40 MG tablet Take 40 mg by mouth at bedtime.      Marland Kitchen diltiazem (CARDIZEM CD) 240 MG 24 hr capsule Take 240 mg by mouth daily.        . enalapril (VASOTEC) 20 MG tablet Take one by mouth twice daily        . gabapentin (NEURONTIN) 300 MG capsule Take one by mouth twice daily        . Multiple Vitamin (MULTIVITAMIN) tablet Take 1 tablet by mouth 2 (two) times daily.       . Omega-3 Fatty Acids (FISH OIL) 1000 MG CAPS Take 1 capsule by mouth daily.       Marland Kitchen warfarin (COUMADIN) 2.5 MG tablet Use as directed by Dr. Sherril Croon.      . zolpidem (AMBIEN) 10 MG tablet Take 10 mg by mouth at bedtime as needed.        . [DISCONTINUED] metFORMIN (GLUCOPHAGE) 500 MG tablet Take 500 mg by mouth every morning.        No current facility-administered medications for this visit.    Past Medical History  Diagnosis Date  . Atrial fibrillation     Paroxysmal  . Stroke     1994  . GERD (gastroesophageal reflux disease)   . Hyperlipidemia   . Hypertension   . Spinal stenosis   . Prostate cancer   . Type 2 diabetes mellitus     Social History Nicholas Berry reports that he has never smoked. He has never used smokeless tobacco. Nicholas Berry reports that he drinks about 2.4 ounces of alcohol per week.  Review of Systems No claudication, no falls, no syncope. Stable appetite. Otherwise negative.  Physical Examination Filed Vitals:   02/19/13 0851  BP: 119/72  Pulse: 71   Filed Weights   02/19/13 0851  Weight: 191 lb (86.637 kg)    No acute distress.  HEENT:  Conjunctiva and lids normal, oropharynx clear.  Neck: Supple, no elevated JVP or carotid bruits, no thyromegaly.  Lungs: Clear to auscultation, nonlabored breathing at rest.  Cardiac: Regular rate and rhythm, no S3 or significant systolic murmur, no pericardial rub.  Abdomen: Soft, nontender, bowel sounds present, no guarding or rebound.  Extremities: No pitting edema, distal pulses 2+.    Problem List and Plan   ATRIAL FIBRILLATION Paroxysmal, currently in sinus rhythm and well controlled overall. No change in current regimen.  ESSENTIAL HYPERTENSION, BENIGN Blood pressure is normal today.    Jonelle Sidle, M.D., F.A.C.C.

## 2013-02-19 NOTE — Patient Instructions (Addendum)

## 2013-02-19 NOTE — Assessment & Plan Note (Signed)
Paroxysmal, currently in sinus rhythm and well controlled overall. No change in current regimen.

## 2013-08-22 ENCOUNTER — Encounter: Payer: Self-pay | Admitting: Cardiology

## 2013-08-22 ENCOUNTER — Ambulatory Visit (INDEPENDENT_AMBULATORY_CARE_PROVIDER_SITE_OTHER): Payer: Medicare Other | Admitting: Cardiology

## 2013-08-22 VITALS — BP 130/83 | HR 67 | Ht 71.0 in | Wt 193.0 lb

## 2013-08-22 DIAGNOSIS — I4891 Unspecified atrial fibrillation: Secondary | ICD-10-CM

## 2013-08-22 DIAGNOSIS — I10 Essential (primary) hypertension: Secondary | ICD-10-CM

## 2013-08-22 NOTE — Assessment & Plan Note (Signed)
No change in current regimen. 

## 2013-08-22 NOTE — Assessment & Plan Note (Signed)
Symptomatically well controlled. Continue current regimen. Coumadin is being followed by Dr. Sherril Croon.

## 2013-08-22 NOTE — Progress Notes (Signed)
    Clinical Summary Nicholas Berry is a 72 y.o.male last seen in May of this year. 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 He reports no palpitations, has had no bleeding problems on Coumadin. Notes no significant change in stamina, no chest pain. He continues to follow with Dr. Sherril Croon.   No Known Allergies  Current Outpatient Prescriptions  Medication Sig Dispense Refill  . atorvastatin (LIPITOR) 40 MG tablet Take 40 mg by mouth at bedtime.      Marland Kitchen diltiazem (CARDIZEM CD) 240 MG 24 hr capsule Take 240 mg by mouth daily.        . enalapril (VASOTEC) 20 MG tablet Take one by mouth twice daily        . gabapentin (NEURONTIN) 300 MG capsule Take one by mouth twice daily        . Multiple Vitamin (MULTIVITAMIN) tablet Take 1 tablet by mouth 2 (two) times daily.       . Omega-3 Fatty Acids (FISH OIL) 1000 MG CAPS Take 1 capsule by mouth daily.       Marland Kitchen warfarin (COUMADIN) 2.5 MG tablet Use as directed by Dr. Sherril Croon.      . zolpidem (AMBIEN) 10 MG tablet Take 10 mg by mouth at bedtime as needed.        . [DISCONTINUED] metFORMIN (GLUCOPHAGE) 500 MG tablet Take 500 mg by mouth every morning.        No current facility-administered medications for this visit.    Past Medical History  Diagnosis Date  . Atrial fibrillation     Paroxysmal  . Stroke     1994  . GERD (gastroesophageal reflux disease)   . Hyperlipidemia   . Hypertension   . Spinal stenosis   . Prostate cancer   . Type 2 diabetes mellitus     Social History Nicholas Berry reports that he has never smoked. He has never used smokeless tobacco. Nicholas Berry reports that he drinks about 2.4 ounces of alcohol per week.  Review of Systems Negative except as outlined.  Physical Examination Filed Vitals:   08/22/13 1044  BP: 130/83  Pulse: 67   Filed Weights   08/22/13 1044  Weight: 193 lb (87.544 kg)    No acute distress.  HEENT: Conjunctiva and lids normal, oropharynx clear.    Neck: Supple, no elevated JVP or carotid bruits, no thyromegaly.  Lungs: Clear to auscultation, nonlabored breathing at rest.  Cardiac: Regular rate and rhythm, no S3 or significant systolic murmur, no pericardial rub.  Abdomen: Soft, nontender, bowel sounds present, no guarding or rebound.  Extremities: No pitting edema, distal pulses 2+.    Problem List and Plan   Atrial fibrillation Symptomatically well controlled. Continue current regimen. Coumadin is being followed by Dr. Sherril Croon.  Essential hypertension, benign No change in current regimen.    Jonelle Sidle, M.D., F.A.C.C.

## 2013-08-22 NOTE — Patient Instructions (Signed)

## 2014-02-13 ENCOUNTER — Encounter: Payer: Self-pay | Admitting: Cardiology

## 2014-02-13 ENCOUNTER — Ambulatory Visit (INDEPENDENT_AMBULATORY_CARE_PROVIDER_SITE_OTHER): Payer: Medicare Other | Admitting: Cardiology

## 2014-02-13 VITALS — BP 125/82 | HR 98 | Ht 71.0 in | Wt 191.0 lb

## 2014-02-13 DIAGNOSIS — I10 Essential (primary) hypertension: Secondary | ICD-10-CM

## 2014-02-13 DIAGNOSIS — I4891 Unspecified atrial fibrillation: Secondary | ICD-10-CM

## 2014-02-13 NOTE — Assessment & Plan Note (Signed)
Paroxysmal, he is in atrial fibrillation today. Plan to continue current regimen. If atrial fibrillation persists or heart rate is not well controlled on current dose of calcium channel blocker, we may get need to make further adjustments. Typically he has done well however on the current regimen. Followup in 6 months, sooner if needed.

## 2014-02-13 NOTE — Progress Notes (Signed)
    Clinical Summary Mr. Engram is a 73 y.o.male last seen in November 2014. He reports no change in functional status, still works with Doctor, hospital. He has had no bleeding problems on Coumadin, INR checked with Dr. Woody Seller. He is not aware of any sense of palpitations, has no exertional chest pain.  ECG from June 2014 showed sinus rhythm at that time. He is in atrial fibrillation on examination today.   No Known Allergies  Current Outpatient Prescriptions  Medication Sig Dispense Refill  . atorvastatin (LIPITOR) 40 MG tablet Take 40 mg by mouth at bedtime.      Marland Kitchen diltiazem (CARDIZEM CD) 240 MG 24 hr capsule Take 240 mg by mouth daily.        . enalapril (VASOTEC) 20 MG tablet Take one by mouth twice daily        . gabapentin (NEURONTIN) 300 MG capsule Take one by mouth twice daily        . Multiple Vitamin (MULTIVITAMIN) tablet Take 1 tablet by mouth 2 (two) times daily.       . Omega-3 Fatty Acids (FISH OIL) 1000 MG CAPS Take 1 capsule by mouth daily.       Marland Kitchen warfarin (COUMADIN) 2.5 MG tablet Use as directed by Dr. Woody Seller.      . zolpidem (AMBIEN) 10 MG tablet Take 10 mg by mouth at bedtime as needed.        . [DISCONTINUED] metFORMIN (GLUCOPHAGE) 500 MG tablet Take 500 mg by mouth every morning.        No current facility-administered medications for this visit.    Past Medical History  Diagnosis Date  . Atrial fibrillation     Paroxysmal  . Stroke     1994  . GERD (gastroesophageal reflux disease)   . Hyperlipidemia   . Hypertension   . Spinal stenosis   . Prostate cancer   . Type 2 diabetes mellitus     Social History Mr. Pfost reports that he has never smoked. He has never used smokeless tobacco. Mr. Genrich reports that he drinks about 2.4 ounces of alcohol per week.  Review of Systems Negative except as outlined.  Physical Examination Filed Vitals:   02/13/14 0934  BP: 125/82  Pulse: 98   Filed Weights   02/13/14 0934  Weight: 191 lb (86.637  kg)    No acute distress.  HEENT: Conjunctiva and lids normal, oropharynx clear.  Neck: Supple, no elevated JVP or carotid bruits, no thyromegaly.  Lungs: Clear to auscultation, nonlabored breathing at rest.  Cardiac: Irregularly irregular, no S3 or significant systolic murmur, no pericardial rub.  Abdomen: Soft, nontender, bowel sounds present, no guarding or rebound.  Extremities: No pitting edema, distal pulses 2+.    Problem List and Plan   Atrial fibrillation Paroxysmal, he is in atrial fibrillation today. Plan to continue current regimen. If atrial fibrillation persists or heart rate is not well controlled on current dose of calcium channel blocker, we may get need to make further adjustments. Typically he has done well however on the current regimen. Followup in 6 months, sooner if needed.  Essential hypertension, benign Blood pressure is normal today.    Satira Sark, M.D., F.A.C.C.

## 2014-02-13 NOTE — Patient Instructions (Signed)
Continue all current medications. Your physician wants you to follow up in: 6 months.  You will receive a reminder letter in the mail one-two months in advance.  If you don't receive a letter, please call our office to schedule the follow up appointment   

## 2014-02-13 NOTE — Assessment & Plan Note (Signed)
Blood pressure is normal today. 

## 2014-08-18 ENCOUNTER — Ambulatory Visit (INDEPENDENT_AMBULATORY_CARE_PROVIDER_SITE_OTHER): Payer: Medicare Other | Admitting: Cardiology

## 2014-08-18 ENCOUNTER — Encounter: Payer: Self-pay | Admitting: Cardiology

## 2014-08-18 VITALS — BP 122/74 | HR 60 | Ht 71.0 in | Wt 187.1 lb

## 2014-08-18 DIAGNOSIS — I4891 Unspecified atrial fibrillation: Secondary | ICD-10-CM

## 2014-08-18 DIAGNOSIS — I10 Essential (primary) hypertension: Secondary | ICD-10-CM

## 2014-08-18 DIAGNOSIS — I480 Paroxysmal atrial fibrillation: Secondary | ICD-10-CM

## 2014-08-18 NOTE — Assessment & Plan Note (Signed)
Symptomatically stable on current regimen including Cardizem CD and Coumadin. He is in sinus rhythm today by ECG. No changes made.

## 2014-08-18 NOTE — Patient Instructions (Signed)

## 2014-08-18 NOTE — Progress Notes (Signed)
   Reason for visit: Atrial fibrillation, hypertension  Clinical Summary Mr. Reinig is a 73 y.o.male last seen in May. He reports no palpitations or obvious breakthrough atrial fibrillation. No change in Cardizem CD, and he continues on Coumadin under the direction of Dr. Woody Seller. He denies any bleeding problems. No chest pain symptoms on exertion, stable NYHA class II dyspnea. He continues to work in the [employer], although has cut back his responsibilities over the last few years.  ECG today shows normal sinus rhythm. He was in atrial fibrillation the last time I saw him.  CHADSVASC score is 5.  Blood pressure is normal today on Cardizem CD and enalapril.   No Known Allergies  Current Outpatient Prescriptions  Medication Sig Dispense Refill  . atorvastatin (LIPITOR) 40 MG tablet Take 40 mg by mouth at bedtime.    Marland Kitchen diltiazem (CARDIZEM CD) 240 MG 24 hr capsule Take 240 mg by mouth daily.      . enalapril (VASOTEC) 20 MG tablet Take one by mouth twice daily      . gabapentin (NEURONTIN) 300 MG capsule Take 300 mg by mouth 3 (three) times daily.     . Multiple Vitamin (MULTIVITAMIN) tablet Take 1 tablet by mouth 2 (two) times daily.     . Omega-3 Fatty Acids (FISH OIL) 1000 MG CAPS Take 1 capsule by mouth 2 (two) times daily.     Marland Kitchen warfarin (COUMADIN) 2.5 MG tablet Use as directed by Dr. Woody Seller.    . zolpidem (AMBIEN) 10 MG tablet Take 10 mg by mouth at bedtime as needed.      . [DISCONTINUED] metFORMIN (GLUCOPHAGE) 500 MG tablet Take 500 mg by mouth every morning.      No current facility-administered medications for this visit.    Past Medical History  Diagnosis Date  . Atrial fibrillation     Paroxysmal  . Stroke     1994  . GERD (gastroesophageal reflux disease)   . Hyperlipidemia   . Hypertension   . Spinal stenosis   . Prostate cancer   . Type 2 diabetes mellitus     Social History Mr. Wolin reports that he has never smoked. He has never used  smokeless tobacco. Mr. Sevin reports that he drinks about 2.4 oz of alcohol per week.  Review of Systems Complete review of systems negative except as otherwise outlined in the clinical summary.  Physical Examination Filed Vitals:   08/18/14 0826  BP: 122/74  Pulse: 60   Filed Weights   08/18/14 0826  Weight: 187 lb 1.9 oz (84.877 kg)   Appears comfortable at rest. HEENT: Conjunctiva and lids normal, oropharynx clear.  Neck: Supple, no elevated JVP or carotid bruits, no thyromegaly.  Lungs: Clear to auscultation, nonlabored breathing at rest.  Cardiac: Irregularly irregular, no S3 or significant systolic murmur, no pericardial rub.  Abdomen: Soft, nontender, bowel sounds present, no guarding or rebound.  Extremities: No pitting edema, distal pulses 2+.    Problem List and Plan   Paroxysmal atrial fibrillation Symptomatically stable on current regimen including Cardizem CD and Coumadin. He is in sinus rhythm today by ECG. No changes made.  Essential hypertension, benign Blood pressure control is good today.    Satira Sark, M.D., F.A.C.C.

## 2014-08-18 NOTE — Assessment & Plan Note (Signed)
Blood pressure control is good today. 

## 2015-02-22 ENCOUNTER — Encounter: Payer: Self-pay | Admitting: Cardiology

## 2015-02-22 NOTE — Progress Notes (Signed)
No show  This encounter was created in error - please disregard.

## 2015-02-25 ENCOUNTER — Ambulatory Visit (INDEPENDENT_AMBULATORY_CARE_PROVIDER_SITE_OTHER): Payer: Medicare Other | Admitting: Cardiology

## 2015-02-25 ENCOUNTER — Encounter: Payer: Self-pay | Admitting: Cardiology

## 2015-02-25 VITALS — BP 125/73 | HR 53 | Ht 71.0 in | Wt 193.8 lb

## 2015-02-25 DIAGNOSIS — I480 Paroxysmal atrial fibrillation: Secondary | ICD-10-CM

## 2015-02-25 DIAGNOSIS — I10 Essential (primary) hypertension: Secondary | ICD-10-CM | POA: Diagnosis not present

## 2015-02-25 NOTE — Patient Instructions (Signed)
Your physician recommends that you continue on your current medications as directed. Please refer to the Current Medication list given to you today. Your physician recommends that you schedule a follow-up appointment in: 6 months. You will receive a reminder letter in the mail in about 4 months reminding you to call and schedule your appointment. If you don't receive this letter, please contact our office. 

## 2015-02-25 NOTE — Progress Notes (Signed)
Cardiology Office Note  Date: 02/25/2015   ID: Nicholas Berry, DOB 08-12-1941, MRN [id number]  PCP: Glenda Chroman., MD  Primary Cardiologist: Rozann Lesches, MD   Chief Complaint  Patient presents with  . Atrial Fibrillation    History of Present Illness: Nicholas Berry is a 74 y.o. male last seen in November 2015. He presents for a routine visit. Reports no palpitations or chest pain. 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  He continues on Coumadin which is followed by Dr. Woody Seller. He does not report any significant bleeding problems.  We discussed his medications, he continues on Cardizem CD for rate control, is in sinus rhythm today.   Past Medical History  Diagnosis Date  . Atrial fibrillation     Paroxysmal  . Stroke     1994  . GERD (gastroesophageal reflux disease)   . Hyperlipidemia   . Hypertension   . Spinal stenosis   . Prostate cancer   . Type 2 diabetes mellitus     Current Outpatient Prescriptions  Medication Sig Dispense Refill  . atorvastatin (LIPITOR) 40 MG tablet Take 40 mg by mouth at bedtime.    Marland Kitchen diltiazem (CARDIZEM CD) 240 MG 24 hr capsule Take 240 mg by mouth daily.      . enalapril (VASOTEC) 20 MG tablet Take one by mouth twice daily      . gabapentin (NEURONTIN) 300 MG capsule Take 300 mg by mouth 3 (three) times daily.     . Multiple Vitamin (MULTIVITAMIN) tablet Take 1 tablet by mouth 2 (two) times daily.     . Omega-3 Fatty Acids (FISH OIL) 1000 MG CAPS Take 1 capsule by mouth 2 (two) times daily.     Marland Kitchen warfarin (COUMADIN) 2.5 MG tablet Use as directed by Dr. Woody Seller.    . zolpidem (AMBIEN) 10 MG tablet Take 10 mg by mouth at bedtime as needed.      . [DISCONTINUED] metFORMIN (GLUCOPHAGE) 500 MG tablet Take 500 mg by mouth every morning.      No current facility-administered medications for this visit.    Allergies:  Review of patient's allergies indicates no known allergies.    Social History: The patient  reports that he has never smoked. He has never used smokeless tobacco. He reports that he drinks about 2.4 oz of alcohol per week. He reports that he does not use illicit drugs.   ROS:  Please see the history of present illness. Otherwise, complete review of systems is positive for arthritic pains.  All other systems are reviewed and negative.   Physical Exam: VS:  BP 125/73 mmHg  Pulse 53  Ht 5\' 11"  (1.803 m)  Wt 193 lb 12.8 oz (87.907 kg)  BMI 27.04 kg/m2  SpO2 97%, BMI Body mass index is 27.04 kg/(m^2).  Wt Readings from Last 3 Encounters:  02/25/15 193 lb 12.8 oz (87.907 kg)  08/18/14 187 lb 1.9 oz (84.877 kg)  02/13/14 191 lb (86.637 kg)     Appears comfortable at rest. HEENT: Conjunctiva and lids normal, oropharynx clear.  Neck: Supple, no elevated JVP or carotid bruits, no thyromegaly.  Lungs: Clear to auscultation, nonlabored breathing at rest.  Cardiac: RRR, no S3 or significant systolic murmur, no pericardial rub.  Abdomen: Soft, nontender, bowel sounds present, no guarding or rebound.  Extremities: No pitting edema, distal pulses 2+.    ECG: ECG is not ordered today.   Assessment and  Plan:  1. Paroxysmal atrial fibrillation. He is symptomatically stable on current regimen. No changes were made.  2. Essential hypertension, blood pressure is well controlled today.  Current medicines were reviewed with the patient today.   Disposition: FU with me in 6 months.   Signed, Satira Sark, MD, [facility] 02/25/2015 2:43 PM    [facility] [place], [place], [place] [address] Phone: [phone]; Fax: [phone]

## 2015-06-02 ENCOUNTER — Encounter (INDEPENDENT_AMBULATORY_CARE_PROVIDER_SITE_OTHER): Payer: Self-pay

## 2015-06-02 ENCOUNTER — Encounter (INDEPENDENT_AMBULATORY_CARE_PROVIDER_SITE_OTHER): Payer: Self-pay | Admitting: *Deleted

## 2015-06-10 ENCOUNTER — Other Ambulatory Visit (INDEPENDENT_AMBULATORY_CARE_PROVIDER_SITE_OTHER): Payer: Self-pay | Admitting: *Deleted

## 2015-06-10 DIAGNOSIS — Z86010 Personal history of colonic polyps: Secondary | ICD-10-CM

## 2015-07-28 ENCOUNTER — Telehealth (INDEPENDENT_AMBULATORY_CARE_PROVIDER_SITE_OTHER): Payer: Self-pay | Admitting: *Deleted

## 2015-07-28 DIAGNOSIS — Z1211 Encounter for screening for malignant neoplasm of colon: Secondary | ICD-10-CM

## 2015-07-28 NOTE — Telephone Encounter (Signed)
Per Sharyn Lull Dr Woody Seller said it is ok for patient to gold Coumadin 5 days prior to TCS sch'd 09/15/15, patient aware

## 2015-07-28 NOTE — Telephone Encounter (Signed)
Patient needs suprep 

## 2015-07-29 MED ORDER — SUPREP BOWEL PREP KIT 17.5-3.13-1.6 GM/177ML PO SOLN: 1.0000 | Freq: Once | ORAL | Status: DC

## 2015-08-16 ENCOUNTER — Telehealth (INDEPENDENT_AMBULATORY_CARE_PROVIDER_SITE_OTHER): Payer: Self-pay | Admitting: *Deleted

## 2015-08-16 NOTE — Telephone Encounter (Signed)
agree

## 2015-08-16 NOTE — Telephone Encounter (Signed)
Referring MD/PCP: vyas   Procedure: tcs  Reason/Indication:  Hx polyps  Has patient had this procedure before?  Yes, 2011 -- scanned  If so, when, by whom and where?    Is there a family history of colon cancer?  no  Who?  What age when diagnosed?    Is patient diabetic?   no      Does patient have prosthetic heart valve or mechanical valve?  no  Do you have a pacemaker?  no  Has patient ever had endocarditis? no  Has patient had joint replacement within last 12 months?  no  Does patient tend to be constipated or take laxatives? no  Does patient have a history of alcohol/drug use?  no  Is patient on Coumadin, Plavix and/or Aspirin? yes  Medications: warfarin 2.5 mg fri-wed and 5 mg on Thursday, diltiazem 240 mg daily, zolpidem 10 mg daily, gabapentin 300 mg tid, atorvastatin 20 mg daily, enalapril 20 mg bid, fish oil 1000 mg bid, vit b12 500 mg daily, multi vit daily  Allergies: nkda  Medication Adjustment: warfarin 5 days  Procedure date & time: 09/15/15 at 1030

## 2015-08-17 ENCOUNTER — Encounter: Payer: Medicare Other | Admitting: Cardiology

## 2015-08-17 ENCOUNTER — Encounter: Payer: Self-pay | Admitting: Cardiology

## 2015-08-17 NOTE — Progress Notes (Signed)
No show  This encounter was created in error - please disregard.

## 2015-08-20 ENCOUNTER — Ambulatory Visit (INDEPENDENT_AMBULATORY_CARE_PROVIDER_SITE_OTHER): Payer: Medicare Other | Admitting: Cardiology

## 2015-08-20 ENCOUNTER — Encounter: Payer: Self-pay | Admitting: Cardiology

## 2015-08-20 VITALS — BP 110/64 | HR 69 | Ht 71.0 in | Wt 196.8 lb

## 2015-08-20 DIAGNOSIS — I480 Paroxysmal atrial fibrillation: Secondary | ICD-10-CM

## 2015-08-20 DIAGNOSIS — I10 Essential (primary) hypertension: Secondary | ICD-10-CM

## 2015-08-20 NOTE — Patient Instructions (Signed)
Your physician recommends that you continue on your current medications as directed. Please refer to the Current Medication list given to you today. Your physician recommends that you schedule a follow-up appointment in: 6 months. You will receive a reminder letter in the mail in about 4 months reminding you to call and schedule your appointment. If you don't receive this letter, please contact our office. 

## 2015-08-20 NOTE — Progress Notes (Signed)
Cardiology Office Note  Date: 08/20/2015   ID: Nicholas Berry, DOB [birth year], MRN [id number]  PCP: Glenda Chroman., MD  Primary Cardiologist: Rozann Lesches, MD   Chief Complaint  Patient presents with  . Atrial Fibrillation    History of Present Illness: Nicholas Berry is a 74 y.o. male last seen in May. He presents for a routine follow-up visit. Continues to do very well without any significant chest pain or palpitations. We discussed his medications which are outlined below. He reports no intolerances and states that he has been compliant. 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  Coumadin follow-up continues with Dr. Woody Seller. He reports no bleeding problems.  ECG today shows normal sinus rhythm.  Past Medical History  Diagnosis Date  . Atrial fibrillation (HCC)     Paroxysmal  . Stroke ([place])     1994  . GERD (gastroesophageal reflux disease)   . Hyperlipidemia   . Hypertension   . Spinal stenosis   . Prostate cancer ([place])   . Type 2 diabetes mellitus ([place])     Current Outpatient Prescriptions  Medication Sig Dispense Refill  . atorvastatin (LIPITOR) 40 MG tablet Take 40 mg by mouth at bedtime.    Marland Kitchen diltiazem (CARDIZEM CD) 240 MG 24 hr capsule Take 240 mg by mouth daily.      . enalapril (VASOTEC) 20 MG tablet Take one by mouth twice daily      . gabapentin (NEURONTIN) 300 MG capsule Take 300 mg by mouth 3 (three) times daily.     . Multiple Vitamin (MULTIVITAMIN) tablet Take 1 tablet by mouth 2 (two) times daily.     . Omega-3 Fatty Acids (FISH OIL) 1000 MG CAPS Take 1 capsule by mouth 2 (two) times daily.     Manus Gunning BOWEL PREP SOLN Take 1 kit by mouth once. 1 Bottle 0  . warfarin (COUMADIN) 2.5 MG tablet Use as directed by Dr. Woody Seller.    . zolpidem (AMBIEN) 10 MG tablet Take 10 mg by mouth at bedtime as needed.      . [DISCONTINUED] metFORMIN (GLUCOPHAGE) 500 MG tablet Take 500 mg by mouth  every morning.      No current facility-administered medications for this visit.    Allergies:  Review of patient's allergies indicates no known allergies.   Social History: The patient  reports that he has never smoked. He has never used smokeless tobacco. He reports that he drinks about 2.4 oz of alcohol per week. He reports that he does not use illicit drugs.   ROS:  Please see the history of present illness. Otherwise, complete review of systems is positive for bilateral hip pain.  All other systems are reviewed and negative.   Physical Exam: VS:  BP 110/64 mmHg  Pulse 69  Ht _0  (1.803 m)  Wt 196 lb 12.8 oz (89.268 kg)  BMI 27.46 kg/m2  SpO2 97%, BMI Body mass index is 27.46 kg/(m^2).  Wt Readings from Last 3 Encounters:  08/20/15 196 lb 12.8 oz (89.268 kg)  02/25/15 193 lb 12.8 oz (87.907 kg)  08/18/14 187 lb 1.9 oz (84.877 kg)     Appears comfortable at rest. HEENT: Conjunctiva and lids normal, oropharynx clear.  Neck: Supple, no elevated JVP or carotid bruits, no thyromegaly.  Lungs: Clear to auscultation, nonlabored breathing at rest.  Cardiac: RRR, no S3 or significant systolic murmur, no pericardial  rub.  Abdomen: Soft, nontender, bowel sounds present, no guarding or rebound.  Extremities: No pitting edema, distal pulses 2+.    ECG: ECG is ordered today.   Assessment and Plan:  1. Paroxysmal atrial fibrillation, symptomatically well controlled on current regimen. He is in sinus rhythm today. No changes made to current regimen including Coumadin and Cardizem CD. CHADSVASC score is 5.  2. Essential hypertension, blood pressure is normal today.  Current medicines were reviewed with the patient today.   Orders Placed This Encounter  Procedures  . EKG 12-Lead    Disposition: FU with me in 6 months.   Signed, Satira Sark, MD, [facility] 08/20/2015 2:43 PM    [facility] [place], [place],   [address] Phone: [phone]; Fax: [phone]

## 2015-09-15 ENCOUNTER — Encounter (HOSPITAL_COMMUNITY)
Admission: RE | Disposition: A | Discharge status: Home / Self Care | Payer: Self-pay | Source: Ambulatory Visit | Attending: Internal Medicine

## 2015-09-15 ENCOUNTER — Ambulatory Visit (HOSPITAL_COMMUNITY)
Admission: RE | Admit: 2015-09-15 | Discharge: 2015-09-15 | Disposition: A | Discharge status: Home / Self Care | Payer: Medicare Other | Source: Ambulatory Visit | Attending: Internal Medicine | Admitting: Internal Medicine

## 2015-09-15 ENCOUNTER — Encounter (HOSPITAL_COMMUNITY): Payer: Self-pay | Admitting: *Deleted

## 2015-09-15 DIAGNOSIS — K644 Residual hemorrhoidal skin tags: Secondary | ICD-10-CM | POA: Insufficient documentation

## 2015-09-15 DIAGNOSIS — Z86010 Personal history of colonic polyps: Secondary | ICD-10-CM | POA: Diagnosis not present

## 2015-09-15 DIAGNOSIS — Z8546 Personal history of malignant neoplasm of prostate: Secondary | ICD-10-CM | POA: Insufficient documentation

## 2015-09-15 DIAGNOSIS — K219 Gastro-esophageal reflux disease without esophagitis: Secondary | ICD-10-CM | POA: Diagnosis not present

## 2015-09-15 DIAGNOSIS — Z1211 Encounter for screening for malignant neoplasm of colon: Secondary | ICD-10-CM | POA: Insufficient documentation

## 2015-09-15 DIAGNOSIS — K5730 Diverticulosis of large intestine without perforation or abscess without bleeding: Secondary | ICD-10-CM | POA: Diagnosis not present

## 2015-09-15 DIAGNOSIS — Z79899 Other long term (current) drug therapy: Secondary | ICD-10-CM | POA: Insufficient documentation

## 2015-09-15 DIAGNOSIS — Z8673 Personal history of transient ischemic attack (TIA), and cerebral infarction without residual deficits: Secondary | ICD-10-CM | POA: Diagnosis not present

## 2015-09-15 DIAGNOSIS — Z8249 Family history of ischemic heart disease and other diseases of the circulatory system: Secondary | ICD-10-CM | POA: Insufficient documentation

## 2015-09-15 DIAGNOSIS — Z7901 Long term (current) use of anticoagulants: Secondary | ICD-10-CM | POA: Diagnosis not present

## 2015-09-15 DIAGNOSIS — D123 Benign neoplasm of transverse colon: Secondary | ICD-10-CM | POA: Insufficient documentation

## 2015-09-15 DIAGNOSIS — Z96643 Presence of artificial hip joint, bilateral: Secondary | ICD-10-CM | POA: Diagnosis not present

## 2015-09-15 DIAGNOSIS — E785 Hyperlipidemia, unspecified: Secondary | ICD-10-CM | POA: Insufficient documentation

## 2015-09-15 DIAGNOSIS — K635 Polyp of colon: Secondary | ICD-10-CM | POA: Insufficient documentation

## 2015-09-15 DIAGNOSIS — D125 Benign neoplasm of sigmoid colon: Secondary | ICD-10-CM | POA: Diagnosis not present

## 2015-09-15 DIAGNOSIS — I480 Paroxysmal atrial fibrillation: Secondary | ICD-10-CM | POA: Insufficient documentation

## 2015-09-15 DIAGNOSIS — K648 Other hemorrhoids: Secondary | ICD-10-CM | POA: Diagnosis not present

## 2015-09-15 DIAGNOSIS — D120 Benign neoplasm of cecum: Secondary | ICD-10-CM | POA: Diagnosis not present

## 2015-09-15 HISTORY — DX: Polyp of colon: K63.5

## 2015-09-15 HISTORY — PX: COLONOSCOPY: SHX5424

## 2015-09-15 SURGERY — COLONOSCOPY
Anesthesia: Moderate Sedation

## 2015-09-15 MED ORDER — MEPERIDINE HCL 50 MG/ML IJ SOLN
INTRAMUSCULAR | Status: AC
  Filled 2015-09-15: qty 1

## 2015-09-15 MED ORDER — MEPERIDINE HCL 50 MG/ML IJ SOLN
INTRAMUSCULAR | Status: DC | PRN
  Administered 2015-09-15 (×2): 25 mg via INTRAVENOUS

## 2015-09-15 MED ORDER — MIDAZOLAM HCL 5 MG/5ML IJ SOLN
INTRAMUSCULAR | Status: DC | PRN
  Administered 2015-09-15 (×4): 2 mg via INTRAVENOUS

## 2015-09-15 MED ORDER — STERILE WATER FOR IRRIGATION IR SOLN
Status: DC | PRN
  Administered 2015-09-15: 10:00:00

## 2015-09-15 MED ORDER — MIDAZOLAM HCL 5 MG/5ML IJ SOLN
INTRAMUSCULAR | Status: AC
  Filled 2015-09-15: qty 10

## 2015-09-15 MED ORDER — SODIUM CHLORIDE 0.9 % IV SOLN
INTRAVENOUS | Status: DC
  Administered 2015-09-15: 10:00:00 via INTRAVENOUS

## 2015-09-15 NOTE — Op Note (Signed)
COLONOSCOPY PROCEDURE REPORT  PATIENT:  Nicholas Berry  MR#:  [id number] Birthdate:  06-04-1941, 74 y.o., male Endoscopist:  Dr. Rogene Houston, MD Referred By:  Dr. Glenda Chroman, MD Procedure Date: 09/15/2015  Procedure:   Colonoscopy  Indications: Patient is 74 year old Caucasian male was history of colonic adenomas and is here for surveillance colonoscopy.  Informed Consent:  The procedure and risks were reviewed with the patient and informed consent was obtained.  Medications:  Demerol 50 mg IV Versed 8 mg IV  Description of procedure:  After a digital rectal exam was performed, that colonoscope was advanced from the anus through the rectum and colon to the area of the cecum, ileocecal valve and appendiceal orifice. The cecum was deeply intubated. These structures were well-seen and photographed for the record. From the level of the cecum and ileocecal valve, the scope was slowly and cautiously withdrawn. The mucosal surfaces were carefully surveyed utilizing scope tip to flexion to facilitate fold flattening as needed. The scope was pulled down into the rectum where a thorough exam including retroflexion was performed.  Findings:   Prep satisfactory. Scattered diverticula at ascending colon and hepatic flexure. Small polyp ablated by cold biopsy from ileocecal valve. Small polyp ablated via cold biopsy from transverse colon. 3 small polyps ablated via cold biopsy from sigmoid colon. All of these polyps were submitted together.  Small hemorrhoids above and below the dentate line.   Therapeutic/Diagnostic Maneuvers Performed:  See above  Complications:  None  EBL: None  Cecal Withdrawal Time:  23 minutes  Impression:  Examination performed to cecum. Few diverticula at ascending colon and hepatic flexure. Five small polyps ablated via cold biopsy as above and submitted together. Small internal and external hemorrhoids.  Recommendations:  Standard instructions  given. Patient will resume warfarin at usual dose and get INR checked in 7-10 days. I will contact patient with biopsy results and further recommendations.  Kimberleigh Mehan U  09/15/2015 11:03 AM  CC: Dr. Glenda Chroman., MD & Dr. Rayne Du ref. provider found

## 2015-09-15 NOTE — H&P (Signed)
Nicholas Berry is an 74 y.o. male.   Chief Complaint: Patient is here for colonoscopy. HPI: Patient is 74 year old Caucasian male with history of colonic adenomas and is here for colonoscopy. He denies abdominal pain change in bowel habits or rectal bleeding. Last colonoscopy was in August 2011 with removal of 3 polyps and were tubular adenomas. Family history is negative for CRC. Patient has been off warfarin for 4 days.  Past Medical History  Diagnosis Date  . Atrial fibrillation (HCC)     Paroxysmal  . Stroke ([place])     1994  . GERD (gastroesophageal reflux disease)   . Hyperlipidemia   . Hypertension   . Spinal stenosis   . Prostate cancer ([place])   . Colon polyps     Past Surgical History  Procedure Laterality Date  . Bilateral total hip replacements    . Bilateral decompressive laminectomies & foraminotomies l4-l5    . Prostate surgery  2007    cancer  . Colonoscopy      Family History  Problem Relation Age of Onset  . Heart attack Brother 37    CABG  . Heart disease Mother   . Heart disease Father    Social History:  reports that he has never smoked. He has never used smokeless tobacco. He reports that he drinks about 16.8 oz of alcohol per week. He reports that he does not use illicit drugs.  Allergies: No Known Allergies  Medications Prior to Admission  Medication Sig Dispense Refill  . atorvastatin (LIPITOR) 20 MG tablet Take 1 tablet by mouth at bedtime.    Marland Kitchen diltiazem (CARDIZEM CD) 240 MG 24 hr capsule Take 240 mg by mouth daily.      . enalapril (VASOTEC) 20 MG tablet Take one by mouth twice daily      . gabapentin (NEURONTIN) 300 MG capsule Take 300 mg by mouth 3 (three) times daily.     . Multiple Vitamin (MULTIVITAMIN) tablet Take 1 tablet by mouth 2 (two) times daily.     . Omega-3 Fatty Acids (FISH OIL) 1000 MG CAPS Take 1 capsule by mouth 2 (two) times daily.     Manus Gunning BOWEL PREP SOLN Take 1 kit by mouth once. 1 Bottle 0  . warfarin (COUMADIN) 2.5  MG tablet Use as directed by Dr. Woody Seller.    . zolpidem (AMBIEN) 10 MG tablet Take 10 mg by mouth at bedtime as needed.        No results found for this or any previous visit (from the past 48 hour(s)). No results found.  ROS  Blood pressure 150/81, pulse 65, temperature 98.4 F (36.9 C), temperature source Oral, resp. rate 9, height 5' 11"  (1.803 m), weight 184 lb (83.462 kg), SpO2 100 %. Physical Exam  Constitutional: He appears well-developed and well-nourished.  HENT:  Mouth/Throat: Oropharynx is clear and moist.  Eyes: Conjunctivae are normal. No scleral icterus.  Neck: No thyromegaly present.  Cardiovascular: Normal rate, regular rhythm and normal heart sounds.   No murmur heard. Respiratory: Effort normal and breath sounds normal.  GI: Soft. He exhibits no distension and no mass. There is no tenderness.  Musculoskeletal: He exhibits no edema.  Lymphadenopathy:    He has no cervical adenopathy.  Neurological: He is alert.  Skin: Skin is warm and dry.     Assessment/Plan History of colonic adenomas. Surveillance colonoscopy.  Ziomara Birenbaum U 09/15/2015, 10:21 AM

## 2015-09-15 NOTE — Discharge Instructions (Signed)
Resume usual medications including warfarin. Resume usual diet. No driving for 24 hours. Get INR checked in 7-10 days. Physician will call with biopsy results.      Colonoscopy, Care After These instructions give you information on caring for yourself after your procedure. Your doctor may also give you more specific instructions. Call your doctor if you have any problems or questions after your procedure. HOME CARE  Do not drive for 24 hours.  Do not sign important papers or use machinery for 24 hours.  You may shower.  You may go back to your usual activities, but go slower for the first 24 hours.  Take rest breaks often during the first 24 hours.  Walk around or use warm packs on your belly (abdomen) if you have belly cramping or gas.  Drink enough fluids to keep your pee (urine) clear or pale yellow.  Resume your normal diet. Avoid heavy or fried foods.  Avoid drinking alcohol for 24 hours or as told by your doctor.  Only take medicines as told by your doctor. If a tissue sample (biopsy) was taken during the procedure:   Do not take aspirin or blood thinners for 7 days, or as told by your doctor.  Do not drink alcohol for 7 days, or as told by your doctor.  Eat soft foods for the first 24 hours. GET HELP IF: You still have a small amount of blood in your poop (stool) 2-3 days after the procedure. GET HELP RIGHT AWAY IF:  You have more than a small amount of blood in your poop.  You see clumps of tissue (blood clots) in your poop.  Your belly is puffy (swollen).  You feel sick to your stomach (nauseous) or throw up (vomit).  You have a fever.  You have belly pain that gets worse and medicine does not help. MAKE SURE YOU:  Understand these instructions.  Will watch your condition.  Will get help right away if you are not doing well or get worse.   This information is not intended to replace advice given to you by your health care provider. Make sure you  discuss any questions you have with your health care provider.   Document Released: 11/04/2010 Document Revised: 10/07/2013 Document Reviewed: 06/09/2013 Elsevier Interactive Patient Education [employer].     Diverticulosis Diverticulosis is the condition that develops when small pouches (diverticula) form in the wall of your colon. Your colon, or large intestine, is where water is absorbed and stool is formed. The pouches form when the inside layer of your colon pushes through weak spots in the outer layers of your colon. CAUSES  No one knows exactly what causes diverticulosis. RISK FACTORS  Being older than 50. Your risk for this condition increases with age. Diverticulosis is rare in people younger than 40 years. By age 94, almost everyone has it.  Eating a low-fiber diet.  Being frequently constipated.  Being overweight.  Not getting enough exercise.  Smoking.  Taking over-the-counter pain medicines, like aspirin and ibuprofen. SYMPTOMS  Most people with diverticulosis do not have symptoms. DIAGNOSIS  Because diverticulosis often has no symptoms, health care providers often discover the condition during an exam for other colon problems. In many cases, a health care provider will diagnose diverticulosis while using a flexible scope to examine the colon (colonoscopy). TREATMENT  If you have never developed an infection related to diverticulosis, you may not need treatment. If you have had an infection before, treatment may include:  Eating more fruits, vegetables, and grains.  Taking a fiber supplement.  Taking a live bacteria supplement (probiotic).  Taking medicine to relax your colon. HOME CARE INSTRUCTIONS   Drink at least 6-8 glasses of water each day to prevent constipation.  Try not to strain when you have a bowel movement.  Keep all follow-up appointments. If you have had an infection before:  Increase the fiber in your diet as directed by your  health care provider or dietitian.  Take a dietary fiber supplement if your health care provider approves.  Only take medicines as directed by your health care provider. SEEK MEDICAL CARE IF:   You have abdominal pain.  You have bloating.  You have cramps.  You have not gone to the bathroom in 3 days. SEEK IMMEDIATE MEDICAL CARE IF:   Your pain gets worse.  Yourbloating becomes very bad.  You have a fever or chills, and your symptoms suddenly get worse.  You begin vomiting.  You have bowel movements that are bloody or black. MAKE SURE YOU:  Understand these instructions.  Will watch your condition.  Will get help right away if you are not doing well or get worse.   This information is not intended to replace advice given to you by your health care provider. Make sure you discuss any questions you have with your health care provider.   Document Released: 06/29/2004 Document Revised: 10/07/2013 Document Reviewed: 08/27/2013 Elsevier Interactive Patient Education 2016 Elsevier Inc[employer]    Colon Polyps Polyps are lumps of extra tissue growing inside the body. Polyps can grow in the large intestine (colon). Most colon polyps are noncancerous (benign). However, some colon polyps can become cancerous over time. Polyps that are larger than a pea may be harmful. To be safe, caregivers remove and test all polyps. CAUSES  Polyps form when mutations in the genes cause your cells to grow and divide even though no more tissue is needed. RISK FACTORS There are a number of risk factors that can increase your chances of getting colon polyps. They include:  Being older than 50 years.  Family history of colon polyps or colon cancer.  Long-term colon diseases, such as colitis or Crohn disease.  Being overweight.  Smoking.  Being inactive.  Drinking too much alcohol. SYMPTOMS  Most small polyps do not cause symptoms. If symptoms are present, they may include:  Blood in the  stool. The stool may look dark red or black.  Constipation or diarrhea that lasts longer than 1 week. DIAGNOSIS People often do not know they have polyps until their caregiver finds them during a regular checkup. Your caregiver can use 4 tests to check for polyps:  Digital rectal exam. The caregiver wears gloves and feels inside the rectum. This test would find polyps only in the rectum.  Barium enema. The caregiver puts a liquid called barium into your rectum before taking X-rays of your colon. Barium makes your colon look white. Polyps are dark, so they are easy to see in the X-ray pictures.  Sigmoidoscopy. A thin, flexible tube (sigmoidoscope) is placed into your rectum. The sigmoidoscope has a light and tiny camera in it. The caregiver uses the sigmoidoscope to look at the last third of your colon.  Colonoscopy. This test is like sigmoidoscopy, but the caregiver looks at the entire colon. This is the most common method for finding and removing polyps. TREATMENT  Any polyps will be removed during a sigmoidoscopy or colonoscopy. The polyps are then tested for cancer.  PREVENTION  To help lower your risk of getting more colon polyps:  Eat plenty of fruits and vegetables. Avoid eating fatty foods.  Do not smoke.  Avoid drinking alcohol.  Exercise every day.  Lose weight if recommended by your caregiver.  Eat plenty of calcium and folate. Foods that are rich in calcium include milk, cheese, and broccoli. Foods that are rich in folate include chickpeas, kidney beans, and spinach. HOME CARE INSTRUCTIONS Keep all follow-up appointments as directed by your caregiver. You may need periodic exams to check for polyps. SEEK MEDICAL CARE IF: You notice bleeding during a bowel movement.   This information is not intended to replace advice given to you by your health care provider. Make sure you discuss any questions you have with your health care provider.   Document Released: 06/28/2004  Document Revised: 10/23/2014 Document Reviewed: 12/12/2011 Elsevier Interactive Patient Education 2016 [employer].   Hemorrhoids Hemorrhoids are swollen veins around the rectum or anus. There are two types of hemorrhoids:   Internal hemorrhoids. These occur in the veins just inside the rectum. They may poke through to the outside and become irritated and painful.  External hemorrhoids. These occur in the veins outside the anus and can be felt as a painful swelling or hard lump near the anus. CAUSES  Pregnancy.   Obesity.   Constipation or diarrhea.   Straining to have a bowel movement.   Sitting for long periods on the toilet.  Heavy lifting or other activity that caused you to strain.  Anal intercourse. SYMPTOMS   Pain.   Anal itching or irritation.   Rectal bleeding.   Fecal leakage.   Anal swelling.   One or more lumps around the anus.  DIAGNOSIS  Your caregiver may be able to diagnose hemorrhoids by visual examination. Other examinations or tests that may be performed include:   Examination of the rectal area with a gloved hand (digital rectal exam).   Examination of anal canal using a small tube (scope).   A blood test if you have lost a significant amount of blood.  A test to look inside the colon (sigmoidoscopy or colonoscopy). TREATMENT Most hemorrhoids can be treated at home. However, if symptoms do not seem to be getting better or if you have a lot of rectal bleeding, your caregiver may perform a procedure to help make the hemorrhoids get smaller or remove them completely. Possible treatments include:   Placing a rubber band at the base of the hemorrhoid to cut off the circulation (rubber band ligation).   Injecting a chemical to shrink the hemorrhoid (sclerotherapy).   Using a tool to burn the hemorrhoid (infrared light therapy).   Surgically removing the hemorrhoid (hemorrhoidectomy).   Stapling the hemorrhoid to block blood  flow to the tissue (hemorrhoid stapling).  HOME CARE INSTRUCTIONS   Eat foods with fiber, such as whole grains, beans, nuts, fruits, and vegetables. Ask your doctor about taking products with added fiber in them (fibersupplements).  Increase fluid intake. Drink enough water and fluids to keep your urine clear or pale yellow.   Exercise regularly.   Go to the bathroom when you have the urge to have a bowel movement. Do not wait.   Avoid straining to have bowel movements.   Keep the anal area dry and clean. Use wet toilet paper or moist towelettes after a bowel movement.   Medicated creams and suppositories may be used or applied as directed.   Only take over-the-counter or prescription medicines  as directed by your caregiver.   Take warm sitz baths for 15-20 minutes, 3-4 times a day to ease pain and discomfort.   Place ice packs on the hemorrhoids if they are tender and swollen. Using ice packs between sitz baths may be helpful.   Put ice in a plastic bag.   Place a towel between your skin and the bag.   Leave the ice on for 15-20 minutes, 3-4 times a day.   Do not use a donut-shaped pillow or sit on the toilet for long periods. This increases blood pooling and pain.  SEEK MEDICAL CARE IF:  You have increasing pain and swelling that is not controlled by treatment or medicine.  You have uncontrolled bleeding.  You have difficulty or you are unable to have a bowel movement.  You have pain or inflammation outside the area of the hemorrhoids. MAKE SURE YOU:  Understand these instructions.  Will watch your condition.  Will get help right away if you are not doing well or get worse.   This information is not intended to replace advice given to you by your health care provider. Make sure you discuss any questions you have with your health care provider.   Document Released: 09/29/2000 Document Revised: 09/18/2012 Document Reviewed: 08/06/2012 Elsevier  Interactive Patient Education [employer].

## 2015-09-17 ENCOUNTER — Encounter (HOSPITAL_COMMUNITY): Payer: Self-pay | Admitting: Internal Medicine

## 2016-02-22 ENCOUNTER — Ambulatory Visit (INDEPENDENT_AMBULATORY_CARE_PROVIDER_SITE_OTHER): Payer: Medicare Other | Admitting: Cardiology

## 2016-02-22 ENCOUNTER — Encounter: Payer: Self-pay | Admitting: Cardiology

## 2016-02-22 VITALS — BP 110/78 | HR 88 | Ht 71.0 in | Wt 194.0 lb

## 2016-02-22 DIAGNOSIS — I10 Essential (primary) hypertension: Secondary | ICD-10-CM

## 2016-02-22 DIAGNOSIS — I480 Paroxysmal atrial fibrillation: Secondary | ICD-10-CM | POA: Diagnosis not present

## 2016-02-22 NOTE — Patient Instructions (Signed)
Your physician recommends that you continue on your current medications as directed. Please refer to the Current Medication list given to you today. Your physician recommends that you schedule a follow-up appointment in: 6 months. You will receive a reminder letter in the mail in about 4 months reminding you to call and schedule your appointment. If you don't receive this letter, please contact our office. 

## 2016-02-22 NOTE — Progress Notes (Signed)
Cardiology Office Note  Date: 02/22/2016   ID: Nicholas Berry, DOB [birth year], MRN [id number]  PCP: Glenda Chroman, MD  Primary Cardiologist: Rozann Lesches, MD   Chief Complaint  Patient presents with  . Atrial Fibrillation    History of Present Illness: Nicholas Berry is a 75 y.o. male last seen in November 2016. He presents for a routine follow-up visit. No change in stamina, no complaints of progressive palpitations or chest pain. 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  He continues on Coumadin with follow-up per Dr. Woody Seller. Reports no spontaneous bleeding problems. Otherwise he is on Cardizem CD for heart rate control.  Blood pressure is well controlled today, he continues on Vasotec with no side effects.  Past Medical History  Diagnosis Date  . Atrial fibrillation (HCC)     Paroxysmal  . Stroke ([place])     1994  . GERD (gastroesophageal reflux disease)   . Hyperlipidemia   . Hypertension   . Spinal stenosis   . Prostate cancer ([place])   . Colon polyps     Current Outpatient Prescriptions  Medication Sig Dispense Refill  . atorvastatin (LIPITOR) 20 MG tablet Take 1 tablet by mouth at bedtime.    Marland Kitchen diltiazem (CARDIZEM CD) 240 MG 24 hr capsule Take 240 mg by mouth daily.      . enalapril (VASOTEC) 20 MG tablet Take one by mouth twice daily      . gabapentin (NEURONTIN) 300 MG capsule Take 300 mg by mouth 3 (three) times daily.     . Multiple Vitamin (MULTIVITAMIN) tablet Take 1 tablet by mouth 2 (two) times daily.     . Omega-3 Fatty Acids (FISH OIL) 1000 MG CAPS Take 1 capsule by mouth 2 (two) times daily.     Marland Kitchen warfarin (COUMADIN) 2.5 MG tablet Use as directed by Dr. Woody Seller.    . zolpidem (AMBIEN) 10 MG tablet Take 10 mg by mouth at bedtime as needed.      . [DISCONTINUED] metFORMIN (GLUCOPHAGE) 500 MG tablet Take 500 mg by mouth every morning.      No current facility-administered medications  for this visit.   Allergies:  Review of patient's allergies indicates no known allergies.   Social History: The patient  reports that he has never smoked. He has never used smokeless tobacco. He reports that he drinks about 16.8 oz of alcohol per week. He reports that he does not use illicit drugs.   ROS:  Please see the history of present illness. Otherwise, complete review of systems is positive for arthritic hip pains.  All other systems are reviewed and negative.   Physical Exam: VS:  BP 110/78 mmHg  Pulse 88  Ht 5\' 11"  (1.803 m)  Wt 194 lb (87.998 kg)  BMI 27.07 kg/m2  SpO2 98%, BMI Body mass index is 27.07 kg/(m^2).  Wt Readings from Last 3 Encounters:  02/22/16 194 lb (87.998 kg)  09/15/15 184 lb (83.462 kg)  08/20/15 196 lb 12.8 oz (89.268 kg)    Appears comfortable at rest. HEENT: Conjunctiva and lids normal, oropharynx clear.  Neck: Supple, no elevated JVP or carotid bruits, no thyromegaly.  Lungs: Clear to auscultation, nonlabored breathing at rest.  Cardiac: Irregularly irregular, no S3 or significant systolic murmur, no pericardial rub.  Abdomen: Soft, nontender, bowel sounds present, no guarding or rebound.  Extremities: No pitting edema, distal pulses 2+.  ECG: I personally  reviewed the prior tracing from 08/20/2015 which showed normal sinus rhythm.  Other Studies Reviewed Today:  Echocardiogram 09/20/2009: Mild LVH with LVEF 55-60%, mild right ventricular and right atrial enlargement. Aortic annular calcification without stenosis, MAC with mild mitral regurgitation, trace tricuspid regurgitation, RVSP 24 mmHg.  Assessment and Plan:  1. Paroxysmal atrial fibrillation, overall symptomatically well controlled on current regimen including Cardizem CD and Coumadin. Keep follow-up with Dr. Woody Seller for anticoagulation management. Goal INR 2.0-3.0.   2. Essential hypertension, blood pressure well controlled today. Continue Vasotec.  Current medicines were reviewed  with the patient today.  Disposition: FU with me in 6 months.   Signed, Satira Sark, MD, [facility] 02/22/2016 8:11 AM    [facility] [address] [place], [place] [address] Phone: [phone]; Fax: [phone]

## 2016-08-21 NOTE — Progress Notes (Signed)
Cardiology Office Note  Date: 08/22/2016   ID: Nicholas Berry, DOB [birth year], MRN [id number]  PCP: Glenda Chroman, MD  Primary Cardiologist: Rozann Lesches, MD   Chief Complaint  Patient presents with  . Atrial Fibrillation    History of Present Illness: Nicholas Berry is a 75 y.o. male last seen in May. He presents for a routine follow-up visit. 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 Overall no major change in stamina. Not bothered by palpitations or chest pain.  He remains on Coumadin with follow-up per Dr. Woody Seller. He has had no bleeding problems.  Medications include Cardizem CD, Lipitor, and Coumadin. I reviewed his ECG today which shows normal sinus rhythm.  Past Medical History:  Diagnosis Date  . Atrial fibrillation (HCC)    Paroxysmal  . Colon polyps   . GERD (gastroesophageal reflux disease)   . Hyperlipidemia   . Hypertension   . Prostate cancer ([place])   . Spinal stenosis   . Stroke [facility])    1994    Current Outpatient Prescriptions  Medication Sig Dispense Refill  . atorvastatin (LIPITOR) 20 MG tablet Take 1 tablet by mouth at bedtime.    Marland Kitchen diltiazem (CARDIZEM CD) 240 MG 24 hr capsule Take 240 mg by mouth daily.      . enalapril (VASOTEC) 20 MG tablet Take one by mouth twice daily      . gabapentin (NEURONTIN) 300 MG capsule Take 300 mg by mouth 3 (three) times daily.     . Multiple Vitamin (MULTIVITAMIN) tablet Take 1 tablet by mouth 2 (two) times daily.     . Omega-3 Fatty Acids (FISH OIL) 1000 MG CAPS Take 1 capsule by mouth 2 (two) times daily.     Marland Kitchen warfarin (COUMADIN) 2.5 MG tablet Use as directed by Dr. Woody Seller.    . zolpidem (AMBIEN) 10 MG tablet Take 10 mg by mouth at bedtime as needed.       No current facility-administered medications for this visit.    Allergies:  Patient has no known allergies.   Social History: The patient  reports that he has never smoked. He has never used smokeless tobacco. He reports that he drinks about  16.8 oz of alcohol per week . He reports that he does not use drugs.   ROS:  Please see the history of present illness. Otherwise, complete review of systems is positive for neuropathy in his hands and feet.  All other systems are reviewed and negative.   Physical Exam: VS:  BP 128/73   Pulse 65   Ht 5\' 11"  (1.803 m)   Wt 193 lb (87.5 kg)   SpO2 98%   BMI 26.92 kg/m , BMI Body mass index is 26.92 kg/m.  Wt Readings from Last 3 Encounters:  08/22/16 193 lb (87.5 kg)  02/22/16 194 lb (88 kg)  09/15/15 184 lb (83.5 kg)    Appears comfortable at rest. HEENT: Conjunctiva and lids normal, oropharynx clear.  Neck: Supple, no elevated JVP or carotid bruits, no thyromegaly.  Lungs: Clear to auscultation, nonlabored breathing at rest.  Cardiac: RRR, no S3 or significant systolic murmur, no pericardial rub.  Abdomen: Soft, nontender, bowel sounds present, no guarding or rebound.  Extremities: No pitting edema, distal pulses 2+.  ECG: I personally reviewed the tracing from 08/20/2015 which showed normal sinus rhythm.  Other Studies Reviewed Today:  Echocardiogram 09/20/2009: Mild LVH with LVEF 55-60%, mild right ventricular and right atrial enlargement. Aortic annular  calcification without stenosis, MAC with mild mitral regurgitation, trace tricuspid regurgitation, RVSP 24 mmHg.  Assessment and Plan:  1. Paroxysmal atrial fibrillation, maintaining sinus rhythm today. He is symptomatically well controlled. We will continue with current regimen including Cardizem CD and Coumadin.  2. Essential hypertension, blood pressure is adequately controlled today.  Current medicines were reviewed with the patient today.   Orders Placed This Encounter  Procedures  . EKG 12-Lead    Disposition: Follow-up in 6 months.  Signed, Satira Sark, MD, [facility] 08/22/2016 2:54 PM    [facility] [place], [place], [place] [address] Phone: [phone]; Fax:  [phone]

## 2016-08-22 ENCOUNTER — Encounter: Payer: Self-pay | Admitting: Cardiology

## 2016-08-22 ENCOUNTER — Ambulatory Visit (INDEPENDENT_AMBULATORY_CARE_PROVIDER_SITE_OTHER): Payer: Medicare Other | Admitting: Cardiology

## 2016-08-22 VITALS — BP 128/73 | HR 65 | Ht 71.0 in | Wt 193.0 lb

## 2016-08-22 DIAGNOSIS — I480 Paroxysmal atrial fibrillation: Secondary | ICD-10-CM

## 2016-08-22 DIAGNOSIS — I10 Essential (primary) hypertension: Secondary | ICD-10-CM | POA: Diagnosis not present

## 2016-08-22 NOTE — Patient Instructions (Signed)

## 2016-11-09 ENCOUNTER — Encounter (HOSPITAL_COMMUNITY): Payer: Self-pay | Admitting: *Deleted

## 2016-11-09 ENCOUNTER — Emergency Department (HOSPITAL_COMMUNITY): Payer: No Typology Code available for payment source

## 2016-11-09 ENCOUNTER — Emergency Department (HOSPITAL_COMMUNITY)
Admission: EM | Admit: 2016-11-09 | Discharge: 2016-11-09 | Disposition: A | Discharge status: Home / Self Care | Payer: No Typology Code available for payment source | Attending: Emergency Medicine | Admitting: Emergency Medicine

## 2016-11-09 DIAGNOSIS — Z79899 Other long term (current) drug therapy: Secondary | ICD-10-CM | POA: Insufficient documentation

## 2016-11-09 DIAGNOSIS — Y999 Unspecified external cause status: Secondary | ICD-10-CM | POA: Diagnosis not present

## 2016-11-09 DIAGNOSIS — R51 Headache: Secondary | ICD-10-CM | POA: Diagnosis not present

## 2016-11-09 DIAGNOSIS — Z8546 Personal history of malignant neoplasm of prostate: Secondary | ICD-10-CM | POA: Diagnosis not present

## 2016-11-09 DIAGNOSIS — Z8673 Personal history of transient ischemic attack (TIA), and cerebral infarction without residual deficits: Secondary | ICD-10-CM | POA: Insufficient documentation

## 2016-11-09 DIAGNOSIS — M25512 Pain in left shoulder: Secondary | ICD-10-CM

## 2016-11-09 DIAGNOSIS — Z7901 Long term (current) use of anticoagulants: Secondary | ICD-10-CM | POA: Diagnosis not present

## 2016-11-09 DIAGNOSIS — Y92410 Unspecified street and highway as the place of occurrence of the external cause: Secondary | ICD-10-CM | POA: Insufficient documentation

## 2016-11-09 DIAGNOSIS — I10 Essential (primary) hypertension: Secondary | ICD-10-CM | POA: Diagnosis not present

## 2016-11-09 DIAGNOSIS — M546 Pain in thoracic spine: Secondary | ICD-10-CM | POA: Insufficient documentation

## 2016-11-09 DIAGNOSIS — Y939 Activity, unspecified: Secondary | ICD-10-CM | POA: Insufficient documentation

## 2016-11-09 DIAGNOSIS — S4992XA Unspecified injury of left shoulder and upper arm, initial encounter: Secondary | ICD-10-CM | POA: Insufficient documentation

## 2016-11-09 LAB — PROTIME-INR
INR: 1.79
Prothrombin Time: 21 seconds — ABNORMAL HIGH (ref 11.4–15.2)

## 2016-11-09 MED ORDER — OXYCODONE-ACETAMINOPHEN 5-325 MG PO TABS
1.0000 | ORAL_TABLET | Freq: Once | ORAL | Status: AC
  Administered 2016-11-09: 1 via ORAL
  Filled 2016-11-09: qty 1

## 2016-11-09 NOTE — ED Notes (Signed)
Patient transported to X-ray 
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
## 2016-11-09 NOTE — ED Notes (Signed)
Pt returned from xray
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
## 2017-02-19 NOTE — Progress Notes (Signed)
Cardiology Office Note  Date: 02/20/2017   ID: Nicholas Berry, DOB [birth year], MRN [id number]  PCP: Glenda Chroman, MD  Primary Cardiologist: Rozann Lesches, MD   Chief Complaint  Patient presents with  . PAF    History of Present Illness: Nicholas Berry is a 76 y.o. male last seen in November 2017. He presents for a routine follow-up visit. Reports no chest pain or palpitations. He has had some knee pain, had fluid drained from his right knee joint just recently. He still works in a [employer].  He follows with Dr. Woody Seller on Coumadin. Reports no bleeding episodes and states he had lab work done earlier this year which we will request. Current medications are outlined below and include Cardizem CD for heart rate control.  Past Medical History:  Diagnosis Date  . Colon polyps   . GERD (gastroesophageal reflux disease)   . Hyperlipidemia   . Hypertension   . Paroxysmal atrial fibrillation (HCC)   . Prostate cancer ([place])   . Spinal stenosis   . Stroke [facility])    1994    Past Surgical History:  Procedure Laterality Date  . BILATERAL DECOMPRESSIVE LAMINECTOMIES & FORAMINOTOMIES L4-L5    . BILATERAL TOTAL HIP REPLACEMENTS    . COLONOSCOPY    . COLONOSCOPY N/A 09/15/2015   Procedure: COLONOSCOPY;  Surgeon: Rogene Houston, MD;  Location: [facility] ENDO SUITE;  Service: Endoscopy;  Laterality: N/A;  1030  . PROSTATE SURGERY  2007   cancer    Current Outpatient Prescriptions  Medication Sig Dispense Refill  . atorvastatin (LIPITOR) 20 MG tablet Take 1 tablet by mouth at bedtime.    Marland Kitchen diltiazem (CARDIZEM CD) 240 MG 24 hr capsule Take 240 mg by mouth daily.      . enalapril (VASOTEC) 20 MG tablet Take one by mouth twice daily      . Multiple Vitamin (MULTIVITAMIN) tablet Take 1 tablet by mouth 2 (two) times daily.     . Omega-3 Fatty Acids (FISH OIL) 1000 MG CAPS Take 1 capsule by mouth 2 (two) times daily.     . pregabalin (LYRICA) 75 MG capsule Take 75 mg by mouth 2 (two)  times daily.    Marland Kitchen warfarin (COUMADIN) 2.5 MG tablet Use as directed by Dr. Woody Seller.    . zolpidem (AMBIEN) 10 MG tablet Take 10 mg by mouth at bedtime as needed.       No current facility-administered medications for this visit.    Allergies:  Patient has no known allergies.   Social History: The patient  reports that he has never smoked. He has never used smokeless tobacco. He reports that he drinks about 16.8 oz of alcohol per week . He reports that he does not use drugs.   ROS:  Please see the history of present illness. Otherwise, complete review of systems is positive for arthritic knee pains.  All other systems are reviewed and negative.   Physical Exam: VS:  BP 106/68   Pulse 83   Ht _0  (1.803 m)   Wt 185 lb (83.9 kg)   SpO2 97% Comment: on room air  BMI 25.80 kg/m , BMI Body mass index is 25.8 kg/m.  Wt Readings from Last 3 Encounters:  02/20/17 185 lb (83.9 kg)  08/22/16 193 lb (87.5 kg)  02/22/16 194 lb (88 kg)    Appears comfortable at rest. HEENT: Conjunctiva and lids normal, oropharynx clear.  Neck: Supple, no elevated JVP or carotid bruits,  no thyromegaly.  Lungs: Clear to auscultation, nonlabored breathing at rest.  Cardiac: RRR, no S3 or significant systolic murmur, no pericardial rub.  Abdomen: Soft, nontender, bowel sounds present, no guarding or rebound.  Extremities: No pitting edema, distal pulses 2+.  ECG: I personally reviewed the tracing from 08/22/2016 which showed normal sinus rhythm.  Other Studies Reviewed Today:  Echocardiogram 09/20/2009: Mild LVH with LVEF 55-60%, mild right ventricular and right atrial enlargement. Aortic annular calcification without stenosis, MAC with mild mitral regurgitation, trace tricuspid regurgitation, RVSP 24 mmHg.  Assessment and Plan:  1. Paroxysmal to persistent atrial fibrillation. He has done well in terms of symptom control on Cardizem CD and also remains on Coumadin for stroke prophylaxis. No changes were  made in regimen today. Requesting most recent lab work from Dr. Woody Seller.  2. Essential hypertension, blood pressure is well controlled today. He is also on Vasotec.  Current medicines were reviewed with the patient today.  Disposition: Follow-up in 6 months.   Signed, Satira Sark, MD, [facility] 02/20/2017 9:24 AM    [facility] [address], [place], [place] [address] Phone: [phone]; Fax: [phone]

## 2017-02-20 ENCOUNTER — Ambulatory Visit (INDEPENDENT_AMBULATORY_CARE_PROVIDER_SITE_OTHER): Payer: Medicare Other | Admitting: Cardiology

## 2017-02-20 ENCOUNTER — Encounter: Payer: Self-pay | Admitting: Cardiology

## 2017-02-20 ENCOUNTER — Encounter: Payer: Self-pay | Admitting: *Deleted

## 2017-02-20 VITALS — BP 106/68 | HR 83 | Ht 71.0 in | Wt 185.0 lb

## 2017-02-20 DIAGNOSIS — I480 Paroxysmal atrial fibrillation: Secondary | ICD-10-CM | POA: Diagnosis not present

## 2017-02-20 DIAGNOSIS — I10 Essential (primary) hypertension: Secondary | ICD-10-CM | POA: Diagnosis not present

## 2017-02-20 NOTE — Patient Instructions (Signed)

## 2017-08-14 NOTE — Patient Instructions (Signed)
Your procedure is scheduled on: 08/27/2017   Report to [facility] at  66   AM.  Call this number if you have problems the morning of surgery: [phone]   Do not eat food or drink liquids :After Midnight.      Take these medicines the morning of surgery with A SIP OF WATER: cardiazem, cymbalta, vasotec, neurontin.   Do not wear jewelry, make-up or nail polish.  Do not wear lotions, powders, or perfumes. You may wear deodorant.  Do not shave 48 hours prior to surgery.  Do not bring valuables to the hospital.  Contacts, dentures or bridgework may not be worn into surgery.  Leave suitcase in the car. After surgery it may be brought to your room.  For patients admitted to the hospital, checkout time is 11:00 AM the day of discharge.   Patients discharged the day of surgery will not be allowed to drive home.  :     Please read over the following fact sheets that you were given: Coughing and Deep Breathing, Surgical Site Infection Prevention, Anesthesia Post-op Instructions and Care and Recovery After Surgery    Cataract A cataract is a clouding of the lens of the eye. When a lens becomes cloudy, vision is reduced based on the degree and nature of the clouding. Many cataracts reduce vision to some degree. Some cataracts make people more near-sighted as they develop. Other cataracts increase glare. Cataracts that are ignored and become worse can sometimes look white. The white color can be seen through the pupil. CAUSES   Aging. However, cataracts may occur at any age, even in newborns.   Certain drugs.   Trauma to the eye.   Certain diseases such as diabetes.   Specific eye diseases such as chronic inflammation inside the eye or a sudden attack of a rare form of glaucoma.   Inherited or acquired medical problems.  SYMPTOMS   Gradual, progressive drop in vision in the affected eye.   Severe, rapid visual loss. This most often happens when trauma is the cause.  DIAGNOSIS  To  detect a cataract, an eye doctor examines the lens. Cataracts are best diagnosed with an exam of the eyes with the pupils enlarged (dilated) by drops.  TREATMENT  For an early cataract, vision may improve by using different eyeglasses or stronger lighting. If that does not help your vision, surgery is the only effective treatment. A cataract needs to be surgically removed when vision loss interferes with your everyday activities, such as driving, reading, or watching TV. A cataract may also have to be removed if it prevents examination or treatment of another eye problem. Surgery removes the cloudy lens and usually replaces it with a substitute lens (intraocular lens, IOL).  At a time when both you and your doctor agree, the cataract will be surgically removed. If you have cataracts in both eyes, only one is usually removed at a time. This allows the operated eye to heal and be out of danger from any possible problems after surgery (such as infection or poor wound healing). In rare cases, a cataract may be doing damage to your eye. In these cases, your caregiver may advise surgical removal right away. The vast majority of people who have cataract surgery have better vision afterward. HOME CARE INSTRUCTIONS  If you are not planning surgery, you may be asked to do the following:  Use different eyeglasses.   Use stronger or brighter lighting.   Ask your eye doctor about  reducing your medicine dose or changing medicines if it is thought that a medicine caused your cataract. Changing medicines does not make the cataract go away on its own.   Become familiar with your surroundings. Poor vision can lead to injury. Avoid bumping into things on the affected side. You are at a higher risk for tripping or falling.   Exercise extreme care when driving or operating machinery.   Wear sunglasses if you are sensitive to bright light or experiencing problems with glare.  SEEK IMMEDIATE MEDICAL CARE IF:   You have  a worsening or sudden vision loss.   You notice redness, swelling, or increasing pain in the eye.   You have a fever.  Document Released: 10/02/2005 Document Revised: 09/21/2011 Document Reviewed: 05/26/2011 [facility] Patient Information 2012 [place].PATIENT INSTRUCTIONS POST-ANESTHESIA  IMMEDIATELY FOLLOWING SURGERY:  Do not drive or operate machinery for the first twenty four hours after surgery.  Do not make any important decisions for twenty four hours after surgery or while taking narcotic pain medications or sedatives.  If you develop intractable nausea and vomiting or a severe headache please notify your doctor immediately.  FOLLOW-UP:  Please make an appointment with your surgeon as instructed. You do not need to follow up with anesthesia unless specifically instructed to do so.  WOUND CARE INSTRUCTIONS (if applicable):  Keep a dry clean dressing on the anesthesia/puncture wound site if there is drainage.  Once the wound has quit draining you may leave it open to air.  Generally you should leave the bandage intact for twenty four hours unless there is drainage.  If the epidural site drains for more than 36-48 hours please call the anesthesia department.  QUESTIONS?:  Please feel free to call your physician or the hospital operator if you have any questions, and they will be happy to assist you.

## 2017-08-20 ENCOUNTER — Encounter (HOSPITAL_COMMUNITY)
Admission: RE | Admit: 2017-08-20 | Discharge: 2017-08-20 | Disposition: A | Discharge status: Home / Self Care | Payer: Medicare Other | Source: Ambulatory Visit | Attending: Ophthalmology | Admitting: Ophthalmology

## 2017-08-20 ENCOUNTER — Encounter (HOSPITAL_COMMUNITY): Payer: Self-pay

## 2017-08-20 ENCOUNTER — Other Ambulatory Visit: Payer: Self-pay

## 2017-08-20 DIAGNOSIS — I4891 Unspecified atrial fibrillation: Secondary | ICD-10-CM | POA: Insufficient documentation

## 2017-08-20 DIAGNOSIS — Z8673 Personal history of transient ischemic attack (TIA), and cerebral infarction without residual deficits: Secondary | ICD-10-CM | POA: Diagnosis not present

## 2017-08-20 DIAGNOSIS — Z01812 Encounter for preprocedural laboratory examination: Secondary | ICD-10-CM | POA: Insufficient documentation

## 2017-08-20 DIAGNOSIS — Z86711 Personal history of pulmonary embolism: Secondary | ICD-10-CM | POA: Diagnosis not present

## 2017-08-20 DIAGNOSIS — I10 Essential (primary) hypertension: Secondary | ICD-10-CM | POA: Diagnosis not present

## 2017-08-20 DIAGNOSIS — K219 Gastro-esophageal reflux disease without esophagitis: Secondary | ICD-10-CM | POA: Insufficient documentation

## 2017-08-20 DIAGNOSIS — F419 Anxiety disorder, unspecified: Secondary | ICD-10-CM | POA: Insufficient documentation

## 2017-08-20 DIAGNOSIS — Z01810 Encounter for preprocedural cardiovascular examination: Secondary | ICD-10-CM | POA: Diagnosis present

## 2017-08-20 HISTORY — DX: Personal history of other diseases of the musculoskeletal system and connective tissue: Z87.39

## 2017-08-20 HISTORY — DX: Anxiety disorder, unspecified: F41.9

## 2017-08-20 LAB — CBC WITH DIFFERENTIAL/PLATELET
BASOS ABS: 0 10*3/uL (ref 0.0–0.1)
BASOS PCT: 1 %
EOS ABS: 0.2 10*3/uL (ref 0.0–0.7)
Eosinophils Relative: 2 %
HCT: 48.3 % (ref 39.0–52.0)
Hemoglobin: 16.7 g/dL (ref 13.0–17.0)
Lymphocytes Relative: 32 %
Lymphs Abs: 2.6 10*3/uL (ref 0.7–4.0)
MCH: 30.2 pg (ref 26.0–34.0)
MCHC: 34.6 g/dL (ref 30.0–36.0)
MCV: 87.3 fL (ref 78.0–100.0)
MONO ABS: 0.6 10*3/uL (ref 0.1–1.0)
MONOS PCT: 8 %
Neutro Abs: 4.8 10*3/uL (ref 1.7–7.7)
Neutrophils Relative %: 57 %
Platelets: 160 10*3/uL (ref 150–400)
RBC: 5.53 MIL/uL (ref 4.22–5.81)
RDW: 12.7 % (ref 11.5–15.5)
WBC: 8.2 10*3/uL (ref 4.0–10.5)

## 2017-08-20 LAB — BASIC METABOLIC PANEL
ANION GAP: 9 (ref 5–15)
BUN: 11 mg/dL (ref 6–20)
CALCIUM: 9.1 mg/dL (ref 8.9–10.3)
CO2: 25 mmol/L (ref 22–32)
Chloride: 105 mmol/L (ref 101–111)
Creatinine, Ser: 0.81 mg/dL (ref 0.61–1.24)
GFR calc non Af Amer: >60 mL/min (ref >60)
Glucose, Bld: 107 mg/dL — ABNORMAL HIGH (ref 65–99)
Potassium: 4.5 mmol/L (ref 3.5–5.1)
SODIUM: 139 mmol/L (ref 135–145)

## 2017-08-20 NOTE — Progress Notes (Signed)
   08/20/17 0809  OBSTRUCTIVE SLEEP APNEA  Have you ever been diagnosed with sleep apnea through a sleep study? No  Do you snore loudly (loud enough to be heard through closed doors)?  1  Do you often feel tired, fatigued, or sleepy during the daytime (such as falling asleep during driving or talking to someone)? 0  Has anyone observed you stop breathing during your sleep? 0  Do you have, or are you being treated for high blood pressure? 1  BMI more than 35 kg/m2? 0  Age > 50 (1-yes) 1  Neck circumference greater than:Male 16 inches or larger, Male 17inches or larger? 0  Male Gender (Yes=1) 1  Obstructive Sleep Apnea Score 4  Score 5 or greater  Results sent to PCP

## 2017-08-27 ENCOUNTER — Encounter (HOSPITAL_COMMUNITY): Payer: Self-pay | Admitting: *Deleted

## 2017-08-27 ENCOUNTER — Ambulatory Visit (HOSPITAL_COMMUNITY): Payer: Medicare Other | Admitting: Anesthesiology

## 2017-08-27 ENCOUNTER — Ambulatory Visit (HOSPITAL_COMMUNITY)
Admission: RE | Admit: 2017-08-27 | Discharge: 2017-08-27 | Disposition: A | Discharge status: Home / Self Care | Payer: Medicare Other | Source: Ambulatory Visit | Attending: Ophthalmology | Admitting: Ophthalmology

## 2017-08-27 ENCOUNTER — Encounter (HOSPITAL_COMMUNITY)
Admission: RE | Disposition: A | Discharge status: Home / Self Care | Payer: Self-pay | Source: Ambulatory Visit | Attending: Ophthalmology

## 2017-08-27 DIAGNOSIS — F419 Anxiety disorder, unspecified: Secondary | ICD-10-CM | POA: Insufficient documentation

## 2017-08-27 DIAGNOSIS — I4891 Unspecified atrial fibrillation: Secondary | ICD-10-CM | POA: Diagnosis not present

## 2017-08-27 DIAGNOSIS — K219 Gastro-esophageal reflux disease without esophagitis: Secondary | ICD-10-CM | POA: Diagnosis not present

## 2017-08-27 DIAGNOSIS — I10 Essential (primary) hypertension: Secondary | ICD-10-CM | POA: Insufficient documentation

## 2017-08-27 DIAGNOSIS — H25811 Combined forms of age-related cataract, right eye: Secondary | ICD-10-CM | POA: Insufficient documentation

## 2017-08-27 DIAGNOSIS — Z79899 Other long term (current) drug therapy: Secondary | ICD-10-CM | POA: Diagnosis not present

## 2017-08-27 HISTORY — PX: CATARACT EXTRACTION W/PHACO: SHX586

## 2017-08-27 SURGERY — PHACOEMULSIFICATION, CATARACT, WITH IOL INSERTION
Anesthesia: Monitor Anesthesia Care | Site: Eye | Laterality: Right

## 2017-08-27 MED ORDER — PHENYLEPHRINE HCL 2.5 % OP SOLN
1.0000 [drp] | OPHTHALMIC | Status: AC
  Administered 2017-08-27 (×3): 1 [drp] via OPHTHALMIC

## 2017-08-27 MED ORDER — PROVISC 10 MG/ML IO SOLN
INTRAOCULAR | Status: DC | PRN
  Administered 2017-08-27: 0.85 mL via INTRAOCULAR

## 2017-08-27 MED ORDER — LIDOCAINE HCL 3.5 % OP GEL
1.0000 "application " | Freq: Once | OPHTHALMIC | Status: AC
  Administered 2017-08-27: 1 via OPHTHALMIC

## 2017-08-27 MED ORDER — CYCLOPENTOLATE-PHENYLEPHRINE 0.2-1 % OP SOLN
1.0000 [drp] | OPHTHALMIC | Status: AC
  Administered 2017-08-27 (×3): 1 [drp] via OPHTHALMIC

## 2017-08-27 MED ORDER — FENTANYL CITRATE (PF) 100 MCG/2ML IJ SOLN
INTRAMUSCULAR | Status: AC
  Filled 2017-08-27: qty 2

## 2017-08-27 MED ORDER — POVIDONE-IODINE 5 % OP SOLN
OPHTHALMIC | Status: DC | PRN
Start: 2017-08-27 — End: 2017-08-27
  Administered 2017-08-27: 1 via OPHTHALMIC

## 2017-08-27 MED ORDER — BSS IO SOLN
INTRAOCULAR | Status: DC | PRN
  Administered 2017-08-27: 15 mL

## 2017-08-27 MED ORDER — NEOMYCIN-POLYMYXIN-DEXAMETH 3.5-10000-0.1 OP SUSP
OPHTHALMIC | Status: DC | PRN
  Administered 2017-08-27: 2 [drp] via OPHTHALMIC

## 2017-08-27 MED ORDER — MIDAZOLAM HCL 2 MG/2ML IJ SOLN
INTRAMUSCULAR | Status: AC
  Filled 2017-08-27: qty 2

## 2017-08-27 MED ORDER — LIDOCAINE HCL (PF) 1 % IJ SOLN
INTRAMUSCULAR | Status: DC | PRN
  Administered 2017-08-27: .6 mL

## 2017-08-27 MED ORDER — EPINEPHRINE PF 1 MG/ML IJ SOLN
INTRAMUSCULAR | Status: DC | PRN
  Administered 2017-08-27: 500 mL

## 2017-08-27 MED ORDER — FENTANYL CITRATE (PF) 100 MCG/2ML IJ SOLN
25.0000 ug | Freq: Once | INTRAMUSCULAR | Status: AC
  Administered 2017-08-27: 25 ug via INTRAVENOUS

## 2017-08-27 MED ORDER — MIDAZOLAM HCL 2 MG/2ML IJ SOLN
1.0000 mg | INTRAMUSCULAR | Status: AC
  Administered 2017-08-27: 2 mg via INTRAVENOUS

## 2017-08-27 MED ORDER — LACTATED RINGERS IV SOLN
INTRAVENOUS | Status: DC
  Administered 2017-08-27: 11:00:00 via INTRAVENOUS

## 2017-08-27 MED ORDER — TETRACAINE HCL 0.5 % OP SOLN
1.0000 [drp] | OPHTHALMIC | Status: AC
Start: 2017-08-27 — End: 2017-08-27
  Administered 2017-08-27 (×3): 1 [drp] via OPHTHALMIC

## 2017-08-27 SURGICAL SUPPLY — 10 items

## 2017-08-27 NOTE — Transfer of Care (Signed)
Immediate Anesthesia Transfer of Care Note  Patient: Nicholas Berry  Procedure(s) Performed: CATARACT EXTRACTION PHACO AND INTRAOCULAR LENS PLACEMENT (IOC) (Right Eye)  Patient Location: Short Stay  Anesthesia Type:MAC  Level of Consciousness: awake  Airway & Oxygen Therapy: Patient Spontanous Breathing  Post-op Assessment: Report given to RN and Post -op Vital signs reviewed and stable  Post vital signs: Reviewed and stable  Last Vitals:  Vitals:   08/27/17 1115 08/27/17 1120  BP: 131/78   Resp: 10 14  Temp:    SpO2: 100% 99%    Last Pain:  Vitals:   08/27/17 0954  TempSrc: Oral      Patients Stated Pain Goal: 7 (07/37/10 6269)  Complications: No apparent anesthesia complications

## 2017-08-27 NOTE — Anesthesia Postprocedure Evaluation (Signed)
Anesthesia Post Note  Patient: Nicholas Berry  Procedure(s) Performed: CATARACT EXTRACTION PHACO AND INTRAOCULAR LENS PLACEMENT ([place]) (Right Eye)  Patient location during evaluation: Short Stay Anesthesia Type: MAC Level of consciousness: awake and alert Pain management: pain level controlled Vital Signs Assessment: post-procedure vital signs reviewed and stable Respiratory status: spontaneous breathing, nonlabored ventilation and respiratory function stable Cardiovascular status: blood pressure returned to baseline Postop Assessment: no apparent nausea or vomiting Anesthetic complications: no     Last Vitals:  Vitals:   08/27/17 1115 08/27/17 1120  BP: 131/78   Resp: 10 14  Temp:    SpO2: 100% 99%    Last Pain:  Vitals:   08/27/17 0954  TempSrc: Oral                 Stylianos Stradling J

## 2017-08-27 NOTE — Anesthesia Preprocedure Evaluation (Signed)
Anesthesia Evaluation  Patient identified by MRN, date of birth, ID band Patient awake    Reviewed: Allergy & Precautions, NPO status , Patient's Chart, lab work & pertinent test results  Airway Mallampati: II  TM Distance: >3 FB     Dental  (+) Teeth Intact   Pulmonary PE   breath sounds clear to auscultation       Cardiovascular hypertension, Pt. on medications + dysrhythmias Atrial Fibrillation  Rhythm:Regular Rate:Normal     Neuro/Psych Anxiety CVA, No Residual Symptoms    GI/Hepatic GERD  ,  Endo/Other    Renal/GU      Musculoskeletal   Abdominal   Peds  Hematology   Anesthesia Other Findings   Reproductive/Obstetrics                             Anesthesia Physical Anesthesia Plan  ASA: III  Anesthesia Plan: MAC   Post-op Pain Management:    Induction: Intravenous  PONV Risk Score and Plan:   Airway Management Planned: Nasal Cannula  Additional Equipment:   Intra-op Plan:   Post-operative Plan:   Informed Consent: I have reviewed the patients History and Physical, chart, labs and discussed the procedure including the risks, benefits and alternatives for the proposed anesthesia with the patient or authorized representative who has indicated his/her understanding and acceptance.     Plan Discussed with:   Anesthesia Plan Comments:         Anesthesia Quick Evaluation

## 2017-08-27 NOTE — Discharge Instructions (Signed)

## 2017-08-27 NOTE — H&P (Signed)
I have reviewed the H&P, the patient was re-examined, and I have identified no interval changes in medical condition and plan of care since the history and physical of record  

## 2017-08-27 NOTE — Op Note (Signed)
Date of Admission: 08/27/2017  Date of Surgery: 08/27/2017  Pre-Op Dx: Cataract Right  Eye  Post-Op Dx: Senile Combined Cataract  Right  Eye,  Dx Code J18.841  Surgeon: Tonny Branch, M.D.  Assistants: None  Anesthesia: Topical with MAC  Indications: Painless, progressive loss of vision with compromise of daily activities.  Surgery: Cataract Extraction with Intraocular lens Implant Right Eye  Discription: The patient had dilating drops and viscous lidocaine placed into the Right eye in the pre-op holding area. After transfer to the operating room, a time out was performed. The patient was then prepped and draped. Beginning with a 43m blade a paracentesis port was made at the surgeon's 2 o'clock position. The anterior chamber was then filled with 1% non-preserved lidocaine. This was followed by filling the anterior chamber with Provisc.  A 2.420mkeratome blade was used to make a clear corneal incision at the temporal limbus.  A bent cystatome needle was used to create a continuous tear capsulotomy. Hydrodissection was performed with balanced salt solution on a Fine canula. The lens nucleus was then removed using the phacoemulsification handpiece. Residual cortex was removed with the I&A handpiece. The anterior chamber and capsular bag were refilled with Provisc. A posterior chamber intraocular lens was placed into the capsular bag with it's injector. The implant was positioned with the Kuglan hook. The Provisc was then removed from the anterior chamber and capsular bag with the I&A handpiece. Stromal hydration of the main incision and paracentesis port was performed with BSS on a Fine canula. The wounds were tested for leak which was negative. The patient tolerated the procedure well. There were no operative complications. The patient was then transferred to the recovery room in stable condition.  Complications: None  Specimen: None  EBL: None  Prosthetic device: J&J Technis, PCB00, power 22.5,  SN [id number]

## 2017-08-28 ENCOUNTER — Encounter (HOSPITAL_COMMUNITY): Payer: Self-pay | Admitting: Ophthalmology

## 2017-08-29 ENCOUNTER — Encounter: Payer: Self-pay | Admitting: Cardiology

## 2017-08-29 NOTE — Progress Notes (Signed)
Cardiology Office Note  Date: 08/30/2017   ID: Nicholas Berry, DOB [birth year], MRN [id number]  PCP: Glenda Chroman, MD  Primary Cardiologist: Rozann Lesches, MD   Chief Complaint  Patient presents with  . Atrial Fibrillation    History of Present Illness: Nicholas Berry is a 76 y.o. male last seen in May.  He presents for a routine follow-up visit.  From a cardiac perspective, he does not report any palpitations or chest pain.  He states that he has been compliant with his medications.  He did just recently undergo eye surgery.  I reviewed the ECG done perioperatively showing atrial fibrillation/flutter.  He is back in sinus rhythm today with regular heart rate.  Current medications include Cardizem CD and Coumadin.  He continues to follow with Dr. Woody Seller on Coumadin.  States recent INRs have been in the 2.0-2.2 range.  He denies any bleeding problems.  Past Medical History:  Diagnosis Date  . Anxiety   . Colon polyps   . GERD (gastroesophageal reflux disease)   . History of gout   . Hyperlipidemia   . Hypertension   . Paroxysmal atrial fibrillation (HCC)   . Prostate cancer ([place])   . Spinal stenosis   . Stroke [facility])    1994    Past Surgical History:  Procedure Laterality Date  . BILATERAL DECOMPRESSIVE LAMINECTOMIES & FORAMINOTOMIES L4-L5    . BILATERAL TOTAL HIP REPLACEMENTS    . CATARACT EXTRACTION W/PHACO Right 08/27/2017   Procedure: CATARACT EXTRACTION PHACO AND INTRAOCULAR LENS PLACEMENT (IOC);  Surgeon: Tonny Branch, MD;  Location: [facility] ORS;  Service: Ophthalmology;  Laterality: Right;  CDE: 10.52  . COLONOSCOPY    . COLONOSCOPY N/A 09/15/2015   Procedure: COLONOSCOPY;  Surgeon: Rogene Houston, MD;  Location: [facility] ENDO SUITE;  Service: Endoscopy;  Laterality: N/A;  1030  . PROSTATE SURGERY  2007   cancer-robotic    Current Outpatient Medications  Medication Sig Dispense Refill  . atorvastatin (LIPITOR) 20 MG tablet Take 20 mg by mouth at bedtime.     Marland Kitchen  diltiazem (CARDIZEM CD) 240 MG 24 hr capsule Take 240 mg by mouth daily.      . DULoxetine (CYMBALTA) 30 MG capsule Take 30 mg by mouth daily.    . enalapril (VASOTEC) 20 MG tablet Take 20 mg by mouth 2 (two) times daily.     Marland Kitchen gabapentin (NEURONTIN) 300 MG capsule Take 300-600 mg by mouth See admin instructions. Take 300 mg by mouth in the morning and take 600 mg by mouth at bedtime    . Multiple Vitamin (MULTIVITAMIN) tablet Take 1 tablet by mouth 2 (two) times daily.     . Omega-3 Fatty Acids (FISH OIL) 1200 MG CAPS Take 1,200 mg by mouth 2 (two) times daily.     . vitamin B-12 (CYANOCOBALAMIN) 500 MCG tablet Take 500 mcg by mouth daily.    Marland Kitchen warfarin (COUMADIN) 2.5 MG tablet Take 2.5-5 mg by mouth See admin instructions. Take 5 mg by mouth at bedtime on Tuesday. Take 2.5 mg by mouth at bedtime on all other nights of the week.    . zolpidem (AMBIEN) 10 MG tablet Take 10 mg by mouth at bedtime.      No current facility-administered medications for this visit.    Allergies:  Patient has no known allergies.   Social History: The patient  reports that  has never smoked. he has never used smokeless tobacco. He reports that he drinks about  8.4 oz of alcohol per week. He reports that he does not use drugs.   ROS:  Please see the history of present illness. Otherwise, complete review of systems is positive for neuropathy, erectile dysfunction.  All other systems are reviewed and negative.   Physical Exam: VS:  BP 120/62   Pulse (!) 54   Ht 5' 11"  (1.803 m)   Wt 189 lb 6.4 oz (85.9 kg)   SpO2 97%   BMI 26.42 kg/m , BMI Body mass index is 26.42 kg/m.  Wt Readings from Last 3 Encounters:  08/30/17 189 lb 6.4 oz (85.9 kg)  08/27/17 186 lb (84.4 kg)  08/20/17 186 lb (84.4 kg)    General: Elderly male, appears comfortable at rest. HEENT: Conjunctiva and lids normal, oropharynx clear. Neck: Supple, no elevated JVP or carotid bruits, no thyromegaly. Lungs: Clear to auscultation, nonlabored  breathing at rest. Cardiac: Regular rate and rhythm, no S3 or significant systolic murmur, no pericardial rub. Abdomen: Soft, nontender, bowel sounds present, no guarding or rebound. Extremities: No pitting edema, distal pulses 2+.  ECG: I personally reviewed the tracing from 08/20/2017 which showed atrial fibrillation/flutter at 106 bpm.  Recent Labwork: 08/20/2017: BUN 11; Creatinine, Ser 0.81; Hemoglobin 16.7; Platelets 160; Potassium 4.5; Sodium 139   Other Studies Reviewed Today:  Echocardiogram 09/20/2009: Mild LVH with LVEF 55-60%, mild right ventricular and right atrial enlargement. Aortic annular calcification without stenosis, MAC with mild mitral regurgitation, trace tricuspid regurgitation, RVSP 24 mmHg.  Assessment and Plan:  1.  Paroxysmal atrial fibrillation/flutter.  He remains symptomatically stable, not bothered by palpitations.  Plan to continue current regimen including Cardizem CD and Coumadin.  Anticoagulation is followed by Dr. Woody Seller.  2.  Essential hypertension, blood pressure well controlled today on Vasotec.  Current medicines were reviewed with the patient today.  Disposition: Follow-up in 6 months.  Signed, Satira Sark, MD, [facility] 08/30/2017 1:04 PM    [place] [facility] [place], [place], [place] [address] Phone: [phone]; Fax: [phone]

## 2017-08-30 ENCOUNTER — Encounter: Payer: Self-pay | Admitting: Cardiology

## 2017-08-30 ENCOUNTER — Ambulatory Visit: Payer: Medicare Other | Admitting: Cardiology

## 2017-08-30 VITALS — BP 120/62 | HR 54 | Ht 71.0 in | Wt 189.4 lb

## 2017-08-30 DIAGNOSIS — I480 Paroxysmal atrial fibrillation: Secondary | ICD-10-CM | POA: Diagnosis not present

## 2017-08-30 DIAGNOSIS — I10 Essential (primary) hypertension: Secondary | ICD-10-CM

## 2017-08-30 NOTE — Patient Instructions (Signed)

## 2017-10-23 ENCOUNTER — Encounter (HOSPITAL_COMMUNITY): Payer: Self-pay

## 2017-10-23 ENCOUNTER — Encounter (HOSPITAL_COMMUNITY)
Admission: RE | Admit: 2017-10-23 | Discharge: 2017-10-23 | Disposition: A | Discharge status: Home / Self Care | Payer: Medicare Other | Source: Ambulatory Visit | Attending: Ophthalmology | Admitting: Ophthalmology

## 2017-10-26 MED ORDER — CYCLOPENTOLATE-PHENYLEPHRINE 0.2-1 % OP SOLN
OPHTHALMIC | Status: AC
  Filled 2017-10-26: qty 2

## 2017-10-26 MED ORDER — PHENYLEPHRINE HCL 2.5 % OP SOLN
OPHTHALMIC | Status: AC
Start: 2017-10-26 — End: 2017-10-26
  Filled 2017-10-26: qty 15

## 2017-10-26 MED ORDER — TETRACAINE HCL 0.5 % OP SOLN
OPHTHALMIC | Status: AC
  Filled 2017-10-26: qty 4

## 2017-10-26 MED ORDER — LIDOCAINE HCL (PF) 1 % IJ SOLN
INTRAMUSCULAR | Status: AC
  Filled 2017-10-26: qty 2

## 2017-10-26 MED ORDER — NEOMYCIN-POLYMYXIN-DEXAMETH 3.5-10000-0.1 OP SUSP
OPHTHALMIC | Status: AC
  Filled 2017-10-26: qty 5

## 2017-10-26 MED ORDER — LIDOCAINE HCL 3.5 % OP GEL
OPHTHALMIC | Status: AC
  Filled 2017-10-26: qty 1

## 2017-10-29 ENCOUNTER — Other Ambulatory Visit: Payer: Self-pay

## 2017-10-29 ENCOUNTER — Ambulatory Visit (HOSPITAL_COMMUNITY): Payer: Medicare Other | Admitting: Anesthesiology

## 2017-10-29 ENCOUNTER — Encounter (HOSPITAL_COMMUNITY)
Admission: RE | Disposition: A | Discharge status: Home / Self Care | Payer: Self-pay | Source: Ambulatory Visit | Attending: Ophthalmology

## 2017-10-29 ENCOUNTER — Encounter (HOSPITAL_COMMUNITY): Payer: Self-pay | Admitting: *Deleted

## 2017-10-29 ENCOUNTER — Ambulatory Visit (HOSPITAL_COMMUNITY)
Admission: RE | Admit: 2017-10-29 | Discharge: 2017-10-29 | Disposition: A | Discharge status: Home / Self Care | Payer: Medicare Other | Source: Ambulatory Visit | Attending: Ophthalmology | Admitting: Ophthalmology

## 2017-10-29 DIAGNOSIS — H25812 Combined forms of age-related cataract, left eye: Secondary | ICD-10-CM | POA: Insufficient documentation

## 2017-10-29 HISTORY — PX: CATARACT EXTRACTION W/PHACO: SHX586

## 2017-10-29 SURGERY — PHACOEMULSIFICATION, CATARACT, WITH IOL INSERTION
Anesthesia: Monitor Anesthesia Care | Site: Eye | Laterality: Left

## 2017-10-29 MED ORDER — MIDAZOLAM HCL 2 MG/2ML IJ SOLN
1.0000 mg | INTRAMUSCULAR | Status: AC
  Administered 2017-10-29: 2 mg via INTRAVENOUS

## 2017-10-29 MED ORDER — PHENYLEPHRINE HCL 2.5 % OP SOLN
1.0000 [drp] | OPHTHALMIC | Status: AC
  Administered 2017-10-29 (×3): 1 [drp] via OPHTHALMIC

## 2017-10-29 MED ORDER — POVIDONE-IODINE 5 % OP SOLN
OPHTHALMIC | Status: DC | PRN
  Administered 2017-10-29: 1 via OPHTHALMIC

## 2017-10-29 MED ORDER — FENTANYL CITRATE (PF) 100 MCG/2ML IJ SOLN
25.0000 ug | Freq: Once | INTRAMUSCULAR | Status: AC
  Administered 2017-10-29: 25 ug via INTRAVENOUS

## 2017-10-29 MED ORDER — PROVISC 10 MG/ML IO SOLN
INTRAOCULAR | Status: DC | PRN
  Administered 2017-10-29: 0.85 mL via INTRAOCULAR

## 2017-10-29 MED ORDER — BSS IO SOLN
INTRAOCULAR | Status: DC | PRN
  Administered 2017-10-29: 500 mL

## 2017-10-29 MED ORDER — LIDOCAINE HCL 3.5 % OP GEL
1.0000 "application " | Freq: Once | OPHTHALMIC | Status: AC
  Administered 2017-10-29: 1 via OPHTHALMIC

## 2017-10-29 MED ORDER — NEOMYCIN-POLYMYXIN-DEXAMETH 3.5-10000-0.1 OP SUSP
OPHTHALMIC | Status: DC | PRN
  Administered 2017-10-29: 2 [drp] via OPHTHALMIC

## 2017-10-29 MED ORDER — LACTATED RINGERS IV SOLN
INTRAVENOUS | Status: DC
  Administered 2017-10-29: 08:00:00 via INTRAVENOUS

## 2017-10-29 MED ORDER — TETRACAINE HCL 0.5 % OP SOLN
1.0000 [drp] | OPHTHALMIC | Status: AC
  Administered 2017-10-29 (×3): 1 [drp] via OPHTHALMIC

## 2017-10-29 MED ORDER — MIDAZOLAM HCL 2 MG/2ML IJ SOLN
INTRAMUSCULAR | Status: AC
  Filled 2017-10-29: qty 2

## 2017-10-29 MED ORDER — FENTANYL CITRATE (PF) 100 MCG/2ML IJ SOLN
INTRAMUSCULAR | Status: AC
  Filled 2017-10-29: qty 2

## 2017-10-29 MED ORDER — CYCLOPENTOLATE-PHENYLEPHRINE 0.2-1 % OP SOLN
1.0000 [drp] | OPHTHALMIC | Status: AC
  Administered 2017-10-29 (×3): 1 [drp] via OPHTHALMIC

## 2017-10-29 MED ORDER — LIDOCAINE HCL (PF) 1 % IJ SOLN
INTRAMUSCULAR | Status: DC | PRN
Start: 2017-10-29 — End: 2017-10-29
  Administered 2017-10-29: .5 mL

## 2017-10-29 MED ORDER — BSS IO SOLN
INTRAOCULAR | Status: DC | PRN
  Administered 2017-10-29: 15 mL

## 2017-10-29 SURGICAL SUPPLY — 10 items

## 2017-10-29 NOTE — H&P (Signed)
I have reviewed the H&P, the patient was re-examined, and I have identified no interval changes in medical condition and plan of care since the history and physical of record  

## 2017-10-29 NOTE — Transfer of Care (Signed)
Immediate Anesthesia Transfer of Care Note  Patient: Nicholas Berry  Procedure(s) Performed: CATARACT EXTRACTION PHACO AND INTRAOCULAR LENS PLACEMENT ([place]) (Left Eye)  Patient Location: Short Stay  Anesthesia Type:MAC  Level of Consciousness: awake, alert  and patient cooperative  Airway & Oxygen Therapy: Patient Spontanous Breathing  Post-op Assessment: Report given to RN and Post -op Vital signs reviewed and stable  Post vital signs: Reviewed and stable  Last Vitals:  Vitals:   10/29/17 0805 10/29/17 0810  BP: 113/75 120/78  Pulse:    Resp: 10 (!) 0  Temp:    SpO2: 98% 98%    Last Pain:  Vitals:   10/29/17 0743  TempSrc: Oral      Patients Stated Pain Goal: 9 (23/53/61 4431)  Complications: No apparent anesthesia complications

## 2017-10-29 NOTE — Op Note (Signed)
Date of Admission: 10/29/2017  Date of Surgery: 10/29/2017  Pre-Op Dx: Cataract Left  Eye  Post-Op Dx: Senile Combined Cataract  Left  Eye,  Dx Code A91.916  Surgeon: Tonny Branch, M.D.  Assistants: None  Anesthesia: Topical with MAC  Indications: Painless, progressive loss of vision with compromise of daily activities.  Surgery: Cataract Extraction with Intraocular lens Implant Left Eye  Discription: The patient had dilating drops and viscous lidocaine placed into the Left eye in the pre-op holding area. After transfer to the operating room, a time out was performed. The patient was then prepped and draped. Beginning with a 86m blade a paracentesis port was made at the surgeon's 2 o'clock position. The anterior chamber was then filled with 1% non-preserved lidocaine. This was followed by filling the anterior chamber with Provisc.  A 2.485mkeratome blade was used to make a clear corneal incision at the temporal limbus.  A bent cystatome needle was used to create a continuous tear capsulotomy. Hydrodissection was performed with balanced salt solution on a Fine canula. The lens nucleus was then removed using the phacoemulsification handpiece. Residual cortex was removed with the I&A handpiece. The anterior chamber and capsular bag were refilled with Provisc. A posterior chamber intraocular lens was placed into the capsular bag with it's injector. The implant was positioned with the Kuglan hook. The Provisc was then removed from the anterior chamber and capsular bag with the I&A handpiece. Stromal hydration of the main incision and paracentesis port was performed with BSS on a Fine canula. The wounds were tested for leak which was negative. The patient tolerated the procedure well. There were no operative complications. The patient was then transferred to the recovery room in stable condition.  Complications: None  Specimen: None  EBL: None  Prosthetic device: J&J Technis, PCB00, power 22.5, SN  [id number]

## 2017-10-29 NOTE — Anesthesia Postprocedure Evaluation (Signed)
Anesthesia Post Note  Patient: Nicholas Berry  Procedure(s) Performed: CATARACT EXTRACTION PHACO AND INTRAOCULAR LENS PLACEMENT ([place]) (Left Eye)  Patient location during evaluation: Short Stay Anesthesia Type: MAC Level of consciousness: awake and alert Pain management: satisfactory to patient Vital Signs Assessment: post-procedure vital signs reviewed and stable Respiratory status: spontaneous breathing Cardiovascular status: stable Postop Assessment: no apparent nausea or vomiting Anesthetic complications: no     Last Vitals:  Vitals:   10/29/17 0810 10/29/17 0834  BP: 120/78 132/71  Pulse:  72  Resp: (!) 0   Temp:  36.5 C  SpO2: 98% 99%    Last Pain:  Vitals:   10/29/17 0834  TempSrc: Oral                 Husayn Reim

## 2017-10-29 NOTE — Anesthesia Procedure Notes (Signed)
Procedure Name: MAC Date/Time: 10/29/2017 8:17 AM Performed by: Vista Deck, CRNA Pre-anesthesia Checklist: Patient identified, Emergency Drugs available, Suction available, Timeout performed and Patient being monitored Patient Re-evaluated:Patient Re-evaluated prior to induction Oxygen Delivery Method: Nasal Cannula

## 2017-10-29 NOTE — Anesthesia Preprocedure Evaluation (Signed)
Anesthesia Evaluation  Patient identified by MRN, date of birth, ID band Patient awake    Reviewed: Allergy & Precautions, NPO status , Patient's Chart, lab work & pertinent test results  Airway Mallampati: II  TM Distance: >3 FB     Dental  (+) Teeth Intact   Pulmonary PE   breath sounds clear to auscultation       Cardiovascular hypertension, Pt. on medications + dysrhythmias Atrial Fibrillation  Rhythm:Regular Rate:Normal     Neuro/Psych Anxiety CVA, No Residual Symptoms    GI/Hepatic GERD  ,  Endo/Other    Renal/GU      Musculoskeletal   Abdominal   Peds  Hematology   Anesthesia Other Findings   Reproductive/Obstetrics                             Anesthesia Physical Anesthesia Plan  ASA: III  Anesthesia Plan: MAC   Post-op Pain Management:    Induction: Intravenous  PONV Risk Score and Plan:   Airway Management Planned: Nasal Cannula  Additional Equipment:   Intra-op Plan:   Post-operative Plan:   Informed Consent: I have reviewed the patients History and Physical, chart, labs and discussed the procedure including the risks, benefits and alternatives for the proposed anesthesia with the patient or authorized representative who has indicated his/her understanding and acceptance.     Plan Discussed with:   Anesthesia Plan Comments:         Anesthesia Quick Evaluation

## 2017-10-30 ENCOUNTER — Encounter (HOSPITAL_COMMUNITY): Payer: Self-pay | Admitting: Ophthalmology

## 2018-03-03 NOTE — Progress Notes (Signed)
Cardiology Office Note  Date: 03/04/2018   ID: [name], DOB 09/15/1941, MRN [id number]  PCP: Glenda Chroman, MD  Primary Cardiologist: Rozann Lesches, MD   Chief Complaint  Patient presents with  . Atrial Fibrillation    History of Present Illness: Nicholas Berry is a 77 y.o. male last seen in November 2018.  He presents for a routine visit. 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  He does not report any chest pain or palpitations.  He continues on Coumadin with follow-up per Dr. Woody Seller.  Does not indicate any bleeding problems.  Current cardiac regimen includes Cardizem CD for heart rate control, Vasotec, Lipitor, and Coumadin.  Past Medical History:  Diagnosis Date  . Anxiety   . Colon polyps   . GERD (gastroesophageal reflux disease)   . History of gout   . Hyperlipidemia   . Hypertension   . Paroxysmal atrial fibrillation (HCC)   . Prostate cancer ([place])   . Spinal stenosis   . Stroke [facility])    1994    Past Surgical History:  Procedure Laterality Date  . BILATERAL DECOMPRESSIVE LAMINECTOMIES & FORAMINOTOMIES L4-L5    . BILATERAL TOTAL HIP REPLACEMENTS    . CATARACT EXTRACTION W/PHACO Right 08/27/2017   Procedure: CATARACT EXTRACTION PHACO AND INTRAOCULAR LENS PLACEMENT (IOC);  Surgeon: Tonny Branch, MD;  Location: [facility] ORS;  Service: Ophthalmology;  Laterality: Right;  CDE: 10.52  . CATARACT EXTRACTION W/PHACO Left 10/29/2017   Procedure: CATARACT EXTRACTION PHACO AND INTRAOCULAR LENS PLACEMENT (IOC);  Surgeon: Tonny Branch, MD;  Location: [facility] ORS;  Service: Ophthalmology;  Laterality: Left;  CDE: 9.84  . COLONOSCOPY    . COLONOSCOPY N/A 09/15/2015   Procedure: COLONOSCOPY;  Surgeon: Rogene Houston, MD;  Location: [facility] ENDO SUITE;  Service: Endoscopy;  Laterality: N/A;  1030  . PROSTATE SURGERY  2007   cancer-robotic    Current Outpatient Medications  Medication Sig Dispense Refill  . atorvastatin (LIPITOR) 20 MG tablet Take 20 mg by  mouth at bedtime.     . Cyanocobalamin (VITAMIN B-12) 5000 MCG TBDP Take 5,000 mcg by mouth every other day.     . diltiazem (CARDIZEM CD) 240 MG 24 hr capsule Take 240 mg by mouth daily.      . DULoxetine (CYMBALTA) 30 MG capsule Take 30 mg by mouth See admin instructions. Take 30 mg by mouth 5 times weekly    . enalapril (VASOTEC) 20 MG tablet Take 20 mg by mouth 2 (two) times daily.     Marland Kitchen gabapentin (NEURONTIN) 300 MG capsule Take 300-600 mg by mouth See admin instructions. Take 300 mg by mouth in the morning, take 300 mg by mouth in the evening with supper and take 600 mg by mouth at bedtime    . Glucosamine-MSM-Hyaluronic Acd (JOINT HEALTH PO) Take 1 tablet by mouth 2 (two) times daily.    . Omega-3 Fatty Acids (FISH OIL) 1200 MG CAPS Take 1,200 mg by mouth 2 (two) times daily.     Marland Kitchen warfarin (COUMADIN) 2.5 MG tablet Take 2.5-5 mg by mouth See admin instructions. Take 5 mg by mouth at bedtime on Tuesday. Take 2.5 mg by mouth at bedtime on all other nights of the week.    . zolpidem (AMBIEN) 10 MG tablet Take 10 mg by mouth at bedtime.      No current facility-administered medications for this visit.    Allergies:  Patient has no known allergies.   Social  History: The patient  reports that he has never smoked. He has never used smokeless tobacco. He reports that he drinks about 8.4 oz of alcohol per week. He reports that he does not use drugs.   ROS:  Please see the history of present illness. Otherwise, complete review of systems is positive for arthritic pains.  All other systems are reviewed and negative.   Physical Exam: VS:  BP (!) 102/58   Pulse 87   Ht 5' 11"  (1.803 m)   Wt 191 lb (86.6 kg)   SpO2 98%   BMI 26.64 kg/m , BMI Body mass index is 26.64 kg/m.  Wt Readings from Last 3 Encounters:  03/04/18 191 lb (86.6 kg)  10/29/17 189 lb (85.7 kg)  08/30/17 189 lb 6.4 oz (85.9 kg)    General: Elderly male, appears comfortable at rest. HEENT: Conjunctiva and lids normal,  oropharynx clear. Neck: Supple, no elevated JVP or carotid bruits, no thyromegaly. Lungs: Clear to auscultation, nonlabored breathing at rest. Cardiac: R irregularly irregular, no S3 or significant systolic murmur. Abdomen: Soft, nontender, bowel sounds present. Extremities: No pitting edema, distal pulses 2+.  ECG: I personally reviewed the tracing from 08/20/2017 which showed atrial fibrillation/flutter at 106 bpm.  Recent Labwork: 08/20/2017: BUN 11; Creatinine, Ser 0.81; Hemoglobin 16.7; Platelets 160; Potassium 4.5; Sodium 139   Other Studies Reviewed Today:  Echocardiogram 09/20/2009: Mild LVH with LVEF 55-60%, mild right ventricular and right atrial enlargement. Aortic annular calcification without stenosis, MAC with mild mitral regurgitation, trace tricuspid regurgitation, RVSP 24 mmHg.  Assessment and Plan:  1.  Paroxysmal to persistent atrial fibrillation/flutter.  He is doing well without palpitations and continues on Cardizem CD along with Coumadin.  No changes made today.  2.  Essential hypertension, blood pressure low normal today.  He is asymptomatic.  Keep follow-up with Dr. Woody Seller.  Current medicines were reviewed with the patient today.  Disposition: Follow-up in 6 months.  Signed, Satira Sark, MD, [facility] 03/04/2018 1:20 PM    [place] [facility] [place], [place], [place] [address] Phone: [phone]; Fax: [phone]

## 2018-03-04 ENCOUNTER — Encounter: Payer: Self-pay | Admitting: Cardiology

## 2018-03-04 ENCOUNTER — Ambulatory Visit (INDEPENDENT_AMBULATORY_CARE_PROVIDER_SITE_OTHER): Payer: Medicare Other | Admitting: Cardiology

## 2018-03-04 VITALS — BP 102/58 | HR 87 | Ht 71.0 in | Wt 191.0 lb

## 2018-03-04 DIAGNOSIS — I10 Essential (primary) hypertension: Secondary | ICD-10-CM | POA: Diagnosis not present

## 2018-03-04 DIAGNOSIS — I480 Paroxysmal atrial fibrillation: Secondary | ICD-10-CM | POA: Diagnosis not present

## 2018-03-04 NOTE — Patient Instructions (Signed)

## 2018-09-06 ENCOUNTER — Ambulatory Visit: Payer: Medicare Other | Admitting: Cardiology

## 2018-10-22 ENCOUNTER — Ambulatory Visit: Payer: Medicare Other | Admitting: Cardiology

## 2018-10-22 NOTE — Progress Notes (Deleted)
Cardiology Office Note  Date: 10/22/2018   ID: Nicholas Berry, DOB 03/02/1941, MRN [id number]  PCP: Glenda Chroman, MD  Primary Cardiologist: Rozann Lesches, MD   No chief complaint on file.   History of Present Illness: Nicholas Berry is a 78 y.o. male last seen in May 2019.  He remains on Coumadin with follow-up per Dr. Woody Seller.  Past Medical History:  Diagnosis Date  . Anxiety   . Colon polyps   . GERD (gastroesophageal reflux disease)   . History of gout   . Hyperlipidemia   . Hypertension   . Paroxysmal atrial fibrillation (HCC)   . Prostate cancer ([place])   . Spinal stenosis   . Stroke [facility])    1994    Past Surgical History:  Procedure Laterality Date  . BILATERAL DECOMPRESSIVE LAMINECTOMIES & FORAMINOTOMIES L4-L5    . BILATERAL TOTAL HIP REPLACEMENTS    . CATARACT EXTRACTION W/PHACO Right 08/27/2017   Procedure: CATARACT EXTRACTION PHACO AND INTRAOCULAR LENS PLACEMENT ([facility]);  Surgeon: Tonny Branch, MD;  Location: [facility] ORS;  Service: Ophthalmology;  Laterality: Right;  CDE: 10.52  . CATARACT EXTRACTION W/PHACO Left 10/29/2017   Procedure: CATARACT EXTRACTION PHACO AND INTRAOCULAR LENS PLACEMENT (I[facility]);  Surgeon: Tonny Branch, MD;  Location: [facility] ORS;  Service: Ophthalmology;  Laterality: Left;  CDE: 9.84  . COLONOSCOPY    . COLONOSCOPY N/A 09/15/2015   Procedure: COLONOSCOPY;  Surgeon: Rogene Houston, MD;  Location: [facility] ENDO SU[facility];  Service: Endoscopy;  Laterality: N/A;  1030  . PROSTATE SURGERY  2007   cancer-robotic    Current Outpatient Medications  Medication Sig Dispense Refill  . atorvastatin (LIPITOR) 20 MG tablet Take 20 mg by mouth at bedtime.     . Cyanocobalamin (VITAMIN B-12) 5000 MCG TBDP Take 5,000 mcg by mouth every other day.     . diltiazem (CARDIZEM CD) 240 MG 24 hr capsule Take 240 mg by mouth daily.      . DULoxetine (CYMBALTA) 30 MG capsule Take 30 mg by mouth See admin instructions. Take 30 mg by mouth 5 times weekly    . enalapril (VASOTEC) 20  MG tablet Take 20 mg by mouth 2 (two) times daily.     Marland Kitchen gabapentin (NEURONTIN) 300 MG capsule Take 300-600 mg by mouth See admin instructions. Take 300 mg by mouth in the morning, take 300 mg by mouth in the evening with supper and take 600 mg by mouth at bedtime    . Glucosamine-MSM-Hyaluronic Acd (JOINT HEALTH PO) Take 1 tablet by mouth 2 (two) times daily.    . Omega-3 Fatty Acids (FISH OIL) 1200 MG CAPS Take 1,200 mg by mouth 2 (two) times daily.     Marland Kitchen warfarin (COUMADIN) 2.5 MG tablet Take 2.5-5 mg by mouth See admin instructions. Take 5 mg by mouth at bedtime on Tuesday. Take 2.5 mg by mouth at bedtime on all other nights of the week.    . zolpidem (AMBIEN) 10 MG tablet Take 10 mg by mouth at bedtime.      No current facility-administered medications for this visit.    Allergies:  Patient has no known allergies.   Social History: The patient  reports that he has never smoked. He has never used smokeless tobacco. He reports current alcohol use of about 14.0 standard drinks of alcohol per week. He reports that he does not use drugs.   Family History: The patient's family history includes Heart attack (age of onset: 68) in his  brother; Heart disease in his father and mother.   ROS:  Please see the history of present illness. Otherwise, complete review of systems is positive for {NONE DEFAULTED:18576::"none"}.  All other systems are reviewed and negative.   Physical Exam: VS:  There were no vitals taken for this visit., BMI There is no height or weight on file to calculate BMI.  Wt Readings from Last 3 Encounters:  03/04/18 191 lb (86.6 kg)  10/29/17 189 lb (85.7 kg)  08/30/17 189 lb 6.4 oz (85.9 kg)    General: Patient appears comfortable at rest. HEENT: Conjunctiva and lids normal, oropharynx clear with moist mucosa. Neck: Supple, no elevated JVP or carotid bruits, no thyromegaly. Lungs: Clear to auscultation, nonlabored breathing at rest. Cardiac: Regular rate and rhythm, no S3 or  significant systolic murmur, no pericardial rub. Abdomen: Soft, nontender, no hepatomegaly, bowel sounds present, no guarding or rebound. Extremities: No pitting edema, distal pulses 2+. Skin: Warm and dry. Musculoskeletal: No kyphosis. Neuropsychiatric: Alert and oriented x3, affect grossly appropriate.  ECG: I personally reviewed the tracing from  Recent Labwork:  08/20/2017: BUN 11; Creatinine, Ser 0.81; Hemoglobin 16.7; Platelets 160; Potassium 4.5; Sodium 139   Other Studies Reviewed Today:  Echocardiogram 09/20/2009: Mild LVH with LVEF 55-60%, mild right ventricular and right atrial enlargement. Aortic annular calcification without stenosis, MAC with mild mitral regurgitation, trace tricuspid regurgitation, RVSP 24 mmHg.  Assessment and Plan:    Current medicines were reviewed with the patient today.  No orders of the defined types were placed in this encounter.   Disposition:  Signed, Satira Sark, MD, [facility] 10/22/2018 10:30 AM    [place] [facility] [place], [place], [place] [address] Phone: [phone]; Fax: [phone]

## 2018-11-11 NOTE — Progress Notes (Signed)
Cardiology Office Note  Date: 11/13/2018   ID: Nicholas Berry, DOB [birth year], MRN [id number]  PCP: Glenda Chroman, MD  Primary Cardiologist: Rozann Lesches, MD   Chief Complaint  Patient presents with  . Atrial Fibrillation    History of Present Illness: Nicholas Berry is a 78 y.o. male last seen in May 2019.  He is here for a routine visit.  Reports no chest pain or palpitations.  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  He is on Coumadin with follow-up per Dr. Woody Seller.  He has had no obvious bleeding problems and states that his INR generally runs in the 2.3-2.5 range.  I personally reviewed his ECG today which shows rate controlled atrial fibrillation with lead motion artifact.  Heart rate control is provided by Cardizem CD at 240 mg daily.  Past Medical History:  Diagnosis Date  . Anxiety   . Colon polyps   . GERD (gastroesophageal reflux disease)   . History of gout   . Hyperlipidemia   . Hypertension   . Paroxysmal atrial fibrillation (HCC)   . Prostate cancer ([place])   . Spinal stenosis   . Stroke [facility])    1994    Past Surgical History:  Procedure Laterality Date  . BILATERAL DECOMPRESSIVE LAMINECTOMIES & FORAMINOTOMIES L4-L5    . BILATERAL TOTAL HIP REPLACEMENTS    . CATARACT EXTRACTION W/PHACO Right 08/27/2017   Procedure: CATARACT EXTRACTION PHACO AND INTRAOCULAR LENS PLACEMENT (IOC);  Surgeon: Tonny Branch, MD;  Location: [facility] OR[facility];  Service: Ophthalmology;  Laterality: Right;  CDE: 10.52  . CATARACT EXTRACTION W/PHACO Left 10/29/2017   Procedure: CATARACT EXTRACTION PHACO AND INTRAOCULAR LENS PLACEMENT (IOC);  Surgeon: Tonny Branch, MD;  Location: [facility] ORS;  Service: Ophthalmology;  Laterality: Left;  CDE: 9.84  . COLONOSCOPY    . COLONOSCOPY N/A 09/15/2015   Procedure: COLONOSCOPY;  Surgeon: Rogene Houston, MD;  Location: [facility] ENDO SU[facility];  Service: Endoscopy;  Laterality: N/A;  1030  . PROSTATE SURGERY  2007   cancer-robotic     Current Outpatient Medications  Medication Sig Dispense Refill  . atorvastatin (LIPITOR) 20 MG tablet Take 20 mg by mouth at bedtime.     . Cyanocobalamin (VITAMIN B-12) 5000 MCG TBDP Take 5,000 mcg by mouth every other day.     . diltiazem (CARDIZEM CD) 240 MG 24 hr capsule Take 240 mg by mouth daily.      . DULoxetine (CYMBALTA) 30 MG capsule Take 30 mg by mouth See admin instructions. Take 30 mg by mouth 5 times weekly    . enalapril (VASOTEC) 20 MG tablet Take 20 mg by mouth 2 (two) times daily.     Marland Kitchen gabapentin (NEURONTIN) 300 MG capsule Take 300-600 mg by mouth See admin instructions. Take 300 mg by mouth in the morning, take 300 mg by mouth in the evening with supper and take 600 mg by mouth at bedtime    . Glucosamine-MSM-Hyaluronic Acd (JOINT HEALTH PO) Take 1 tablet by mouth 2 (two) times daily.    . Omega-3 Fatty Acids (FISH OIL) 1200 MG CAPS Take 1,200 mg by mouth 2 (two) times daily.     Marland Kitchen warfarin (COUMADIN) 2.5 MG tablet Take 2.5-5 mg by mouth See admin instructions. Take 5 mg by mouth at bedtime on Tuesday. Take 2.5 mg by mouth at bedtime on all other nights of the week.    . zolpidem (AMBIEN) 10 MG tablet Take 10 mg by mouth  at bedtime.      No current facility-administered medications for this visit.    Allergies:  Patient has no known allergies.   Social History: The patient  reports that he has never smoked. He has never used smokeless tobacco. He reports current alcohol use of about 14.0 standard drinks of alcohol per week. He reports that he does not use drugs.   ROS:  Please see the history of present illness. Otherwise, complete review of systems is positive for arthritic pain and stiffness.  All other systems are reviewed and negative.   Physical Exam: VS:  BP 118/80   Pulse 66   Ht _0  (1.803 m)   Wt 192 lb (87.1 kg)   SpO2 97%   BMI 26.78 kg/m , BMI Body mass index is 26.78 kg/m.  Wt Readings from Last 3 Encounters:  11/13/18 192 lb (87.1 kg)   03/04/18 191 lb (86.6 kg)  10/29/17 189 lb (85.7 kg)    General: Elderly male, appears comfortable at rest. HEENT: Conjunctiva and lids normal, oropharynx clear. Neck: Supple, no elevated JVP or carotid bruits, no thyromegaly. Lungs: Clear to auscultation, nonlabored breathing at rest. Cardiac: Irregularly irregular, no S3 or significant systolic murmur. Abdomen: Soft, nontender, bowel sounds present. Extremities: No pitting edema, distal pulses 2+.  ECG: I personally reviewed the tracing from 08/20/2017 which showed atrial fibrillation/flutter at 106 bpm.  Recent Labwork:  08/20/2017: BUN 11; Creatinine, Ser 0.81; Hemoglobin 16.7; Platelets 160; Potassium 4.5; Sodium 139   Other Studies Reviewed Today:  Echocardiogram 09/20/2009: Mild LVH with LVEF 55-60%, mild right ventricular and right atrial enlargement. Aortic annular calcification without stenosis, MAC with mild mitral regurgitation, trace tricuspid regurgitation, RVSP 24 mmHg.  Assessment and Plan:  1.  Permanent atrial fibrillation.  Plan to continue strategy of heart rate control and anticoagulation as he is asymptomatic in terms of palpitations.  Continue Cardizem CD and Coumadin, INR is followed by Dr. Woody Seller.  2.  Essential hypertension, blood pressure is well controlled today.  No changes made to current regimen.  Current medicines were reviewed with the patient today.   Orders Placed This Encounter  Procedures  . EKG 12-Lead    Disposition: Follow-up in 6 months.  Signed, Satira Sark, MD, [facility] 11/13/2018 8:16 AM    [facility] [place], [place], [place] [address] Phone: [phone]; Fax: [phone]

## 2018-11-13 ENCOUNTER — Encounter: Payer: Self-pay | Admitting: Cardiology

## 2018-11-13 ENCOUNTER — Ambulatory Visit: Payer: Medicare Other | Admitting: Cardiology

## 2018-11-13 VITALS — BP 118/80 | HR 66 | Ht 71.0 in | Wt 192.0 lb

## 2018-11-13 DIAGNOSIS — I10 Essential (primary) hypertension: Secondary | ICD-10-CM

## 2018-11-13 DIAGNOSIS — I4821 Permanent atrial fibrillation: Secondary | ICD-10-CM | POA: Diagnosis not present

## 2018-11-13 NOTE — Patient Instructions (Signed)
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
## 2019-05-19 ENCOUNTER — Other Ambulatory Visit: Payer: Self-pay

## 2019-05-19 ENCOUNTER — Ambulatory Visit (INDEPENDENT_AMBULATORY_CARE_PROVIDER_SITE_OTHER): Payer: Medicare Other | Admitting: Cardiology

## 2019-05-19 ENCOUNTER — Encounter: Payer: Self-pay | Admitting: Cardiology

## 2019-05-19 VITALS — BP 112/64 | HR 84 | Temp 98.7°F | Ht 71.0 in | Wt 173.0 lb

## 2019-05-19 DIAGNOSIS — I4821 Permanent atrial fibrillation: Secondary | ICD-10-CM

## 2019-05-19 DIAGNOSIS — I10 Essential (primary) hypertension: Secondary | ICD-10-CM | POA: Diagnosis not present

## 2019-05-19 NOTE — Progress Notes (Signed)
Cardiology Office Note  Date: 05/19/2019   ID: Nicholas Berry, DOB Sep 04, 1941, MRN [id number]  PCP:  Glenda Chroman, MD  Cardiologist:  Rozann Lesches, MD Electrophysiologist:  None   Chief Complaint  Patient presents with  . Atrial Fibrillation    History of Present Illness: Nicholas Berry is a 78 y.o. male last seen in January.  He is here for a routine visit.  Overall no change from a cardiac perspective, no significant sense of palpitations and no chest pain with exertion.  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  I reviewed his medications which are stable from a cardiac perspective.  He remains on Coumadin with follow-up by Dr. Woody Seller.  He does not report any bleeding problems.  States that his last INR was 2.5.  Past Medical History:  Diagnosis Date  . Anxiety   . Colon polyps   . GERD (gastroesophageal reflux disease)   . History of gout   . Hyperlipidemia   . Hypertension   . Paroxysmal atrial fibrillation (HCC)   . Prostate cancer ([place])   . Spinal stenosis   . Stroke [facility])    1994    Past Surgical History:  Procedure Laterality Date  . BILATERAL DECOMPRESSIVE LAMINECTOMIES & FORAMINOTOMIES L4-L5    . BILATERAL TOTAL HIP REPLACEMENTS    . CATARACT EXTRACTION W/PHACO Right 08/27/2017   Procedure: CATARACT EXTRACTION PHACO AND INTRAOCULAR LENS PLACEMENT (IOC);  Surgeon: Tonny Branch, MD;  Location: [facility] OR[facility];  Service: Ophthalmology;  Laterality: Right;  CDE: 10.52  . CATARACT EXTRACTION W/PHACO Left 10/29/2017   Procedure: CATARACT EXTRACTION PHACO AND INTRAOCULAR LENS PLACEMENT (IOC);  Surgeon: Tonny Branch, MD;  Location: [facility] OR[facility];  Service: Ophthalmology;  Laterality: Left;  CDE: 9.84  . COLONOSCOPY    . COLONOSCOPY N/A 09/15/2015   Procedure: COLONOSCOPY;  Surgeon: Rogene Houston, MD;  Location: [facility] END[facility] SU[facility];  Service: Endoscopy;  Laterality: N/A;  1030  . PROSTATE SURGERY  2007   cancer-robotic    Current Outpatient Medications  Medication Sig Dispense Refill  . atorvastatin (LIPITOR) 20 MG tablet Take 20 mg by mouth at bedtime.     . Cyanocobalamin (VITAMIN B-12) 5000 MCG TBDP Take 5,000 mcg by mouth every other day.     . diltiazem (CARDIZEM CD) 240 MG 24 hr capsule Take 240 mg by mouth daily.      . DULoxetine (CYMBALTA) 30 MG capsule Take 30 mg by mouth See admin instructions. Take 30 mg by mouth 5 times weekly    . enalapril (VASOTEC) 20 MG tablet Take 20 mg by mouth 2 (two) times daily.     . Glucosamine-MSM-Hyaluronic Acd (JOINT HEALTH PO) Take 1 tablet by mouth 2 (two) times daily.    . Omega-3 Fatty Acids (FISH OIL) 1200 MG CAPS Take 1,200 mg by mouth 2 (two) times daily.     Marland Kitchen warfarin (COUMADIN) 2.5 MG tablet Take 2.5-5 mg by mouth See admin instructions. Take 5 mg by mouth at bedtime on Tuesday. Take 2.5 mg by mouth at bedtime on all other nights of the week.    . zolpidem (AMBIEN) 10 MG tablet Take 10 mg by mouth at bedtime.      No current facility-administered medications for this visit.    Allergies:  Patient has no known allergies.   Social History: The patient  reports that he has never smoked. He has never used smokeless tobacco.  He reports current alcohol use of about 14.0 standard drinks of alcohol per week. He reports that he does not use drugs.   ROS:  Please see the history of present illness. Otherwise, complete review of systems is positive for none.  All other systems are reviewed and negative.   Physical Exam: VS:  BP 112/64   Pulse 84   Temp 98.7 F (37.1 C)   Ht _0  (1.803 m)   Wt 173 lb (78.5 kg)   SpO2 98%   BMI 24.13 kg/m , BMI Body mass index is 24.13 kg/m.  Wt Readings from Last 3 Encounters:  05/19/19 173 lb (78.5 kg)  11/13/18 192 lb (87.1 kg)  03/04/18 191 lb (86.6 kg)    General: Elderly male, appears comfortable at rest. HEENT: Conjunctiva and lids normal, wearing a mask. Neck: Supple, no elevated JVP or  carotid bruits, no thyromegaly. Lungs: Clear to auscultation, nonlabored breathing at rest. Cardiac: Irregularly irregular, no S3 or significant systolic murmur, no pericardial rub. Abdomen: Soft, nontender,bowel sounds prsent. Extremities: No pitting edema, distal pulses 2+.  ECG:  An ECG dated 11/13/2018 was personally reviewed today and demonstrated:  Rate controlled atrial fibrillation with lead motion artifact.  Recent Labwork:  No interval lab work for review.  Other Studies Reviewed Today:  Echocardiogram 09/20/2009: Mild LVH with LVEF 55-60%, mild right ventricular and right atrial enlargement. Aortic annular calcification without stenosis, MAC with mild mitral regurgitation, trace tricuspid regurgitation, RVSP 24 mmHg.  Assessment and Plan:  1.  Permanent atrial fibrillation.  Remains asymptomatic with heart rate control on Cardizem CD and stroke prophylaxis with Coumadin.  INR is followed by Dr. Woody Seller.  2.  Essential hypertension, blood pressure is well controlled today.  Medication Adjustments/Labs and Tests Ordered: Current medicines are reviewed at length with the patient today.  Concerns regarding medicines are outlined above.   Tests Ordered: No orders of the defined types were placed in this encounter.   Medication Changes: No orders of the defined types were placed in this encounter.   Disposition:  Follow up 6 months in the [place] office.  Signed, Satira Sark, MD, [facility] 05/19/2019 1:38 PM    [facility], [place], [place] [address] Phone: [phone]; Fax: [phone]

## 2019-05-19 NOTE — Patient Instructions (Signed)

## 2019-08-02 ENCOUNTER — Emergency Department (HOSPITAL_COMMUNITY)
Admission: EM | Admit: 2019-08-02 | Discharge: 2019-08-02 | Disposition: A | Discharge status: Home / Self Care | Payer: Medicare Other | Attending: Emergency Medicine | Admitting: Emergency Medicine

## 2019-08-02 ENCOUNTER — Encounter (HOSPITAL_COMMUNITY): Payer: Self-pay | Admitting: Emergency Medicine

## 2019-08-02 ENCOUNTER — Other Ambulatory Visit: Payer: Self-pay

## 2019-08-02 DIAGNOSIS — I10 Essential (primary) hypertension: Secondary | ICD-10-CM | POA: Insufficient documentation

## 2019-08-02 DIAGNOSIS — Z79899 Other long term (current) drug therapy: Secondary | ICD-10-CM | POA: Insufficient documentation

## 2019-08-02 DIAGNOSIS — Z7901 Long term (current) use of anticoagulants: Secondary | ICD-10-CM | POA: Insufficient documentation

## 2019-08-02 DIAGNOSIS — Z7984 Long term (current) use of oral hypoglycemic drugs: Secondary | ICD-10-CM | POA: Insufficient documentation

## 2019-08-02 DIAGNOSIS — Z8546 Personal history of malignant neoplasm of prostate: Secondary | ICD-10-CM | POA: Diagnosis not present

## 2019-08-02 DIAGNOSIS — R2242 Localized swelling, mass and lump, left lower limb: Secondary | ICD-10-CM | POA: Insufficient documentation

## 2019-08-02 DIAGNOSIS — M79605 Pain in left leg: Secondary | ICD-10-CM | POA: Insufficient documentation

## 2019-08-02 NOTE — Discharge Instructions (Addendum)
Elevate your leg when possible and wear your compression stockings daily.  You are scheduled to return here tomorrow morning for an appt at 9:30 am to have an ultrasound of your left leg.  Arrive 15 minutes early to register.  You may call the vascular doctor listed on Monday to arrange a follow-up appt.  Tylenol every 4 hrs if needed for pain

## 2019-08-02 NOTE — ED Triage Notes (Signed)
Patient complains of left lower leg pain x 3 days. Patient wears compression stockings due to varicose veins.

## 2019-08-02 NOTE — ED Notes (Signed)
Pt c hx of varicose veins   Here for pain eval

## 2019-08-02 NOTE — ED Provider Notes (Signed)
[facility] EMERGENCY DEPARTMENT Provider Note   CSN: [id number] Arrival date & time: 08/02/19  1516     History   Chief Complaint Chief Complaint  Patient presents with  . Leg Pain    HPI Nicholas Berry is a 78 y.o. male.     HPI   Nicholas Berry is a 78 y.o. male with a past medical history of paroxysmal A. fib, hypertension, hyperlipidemia, and he is anticoagulated with Coumadin. he presents to the Emergency Department complaining of pain and a localized area of swelling to the medial aspect of his left lower leg.  He reports a history of varicose veins and states this area has been swollen secondary to his varicosities for some time, but reports pain to the same site for 3 days.  Pain is worse with palpation and weightbearing.  He describes the pain as somewhat intermittent.  He wears compression stockings, but not consistently.  He states that he was seen by his PCP a while back for this and was referred to a vascular surgeon but was never seen for his appointment.  He denies redness, recent injury, fever, chills, or pain radiating proximally or distally.  No recent extended travel.    Past Medical History:  Diagnosis Date  . Anxiety   . Colon polyps   . GERD (gastroesophageal reflux disease)   . History of gout   . Hyperlipidemia   . Hypertension   . Paroxysmal atrial fibrillation (HCC)   . Prostate cancer ([place])   . Spinal stenosis   . Stroke [facility])    1994    Patient Active Problem List   Diagnosis Date Noted  . Cholelithiasis 01/05/2012  . Essential hypertension, benign 04/28/2010  . Mixed hyperlipidemia 10/28/2009  . Paroxysmal atrial fibrillation ([place]) 10/27/2009    Past Surgical History:  Procedure Laterality Date  . BILATERAL DECOMPRESSIVE LAMINECTOMIES & FORAMINOTOMIES L4-L5    . BILATERAL TOTAL HIP REPLACEMENTS    . CATARACT EXTRACTION W/PHACO Right 08/27/2017   Procedure: CATARACT EXTRACTION PHACO AND INTRAOCULAR LENS PLACEMENT (IOC);  Surgeon:  Tonny Branch, MD;  Location: [facility] ORS;  Service: Ophthalmology;  Laterality: Right;  CDE: 10.52  . CATARACT EXTRACTION W/PHACO Left 10/29/2017   Procedure: CATARACT EXTRACTION PHACO AND INTRAOCULAR LENS PLACEMENT (IOC);  Surgeon: Tonny Branch, MD;  Location: [facility] ORS;  Service: Ophthalmology;  Laterality: Left;  CDE: 9.84  . COLONOSCOPY    . COLONOSCOPY N/A 09/15/2015   Procedure: COLONOSCOPY;  Surgeon: Rogene Houston, MD;  Location: [facility] ENDO SUITE;  Service: Endoscopy;  Laterality: N/A;  1030  . PROSTATE SURGERY  2007   cancer-robotic        Home Medications    Prior to Admission medications   Medication Sig Start Date End Date Taking? Authorizing Provider  atorvastatin (LIPITOR) 20 MG tablet Take 20 mg by mouth at bedtime.  08/05/15   [provider]  Cyanocobalamin (VITAMIN B-12) 5000 MCG TBDP Take 5,000 mcg by mouth every other day.     [provider]  diltiazem (CARDIZEM CD) 240 MG 24 hr capsule Take 240 mg by mouth daily.      [provider]  DULoxetine (CYMBALTA) 30 MG capsule Take 30 mg by mouth See admin instructions. Take 30 mg by mouth 5 times weekly 07/18/17   [provider]  enalapril (VASOTEC) 20 MG tablet Take 20 mg by mouth 2 (two) times daily.     [provider]  gabapentin (NEURONTIN) 300 MG capsule Take 300  mg by mouth 3 (three) times daily.    [provider]  Glucosamine-MSM-Hyaluronic Acd (JOINT HEALTH PO) Take 1 tablet by mouth 2 (two) times daily.    [provider]  Multiple Vitamin (MULTIVITAMIN) tablet Take 1 tablet by mouth daily.    [provider]  Omega-3 Fatty Acids (FISH OIL) 1200 MG CAPS Take 1,200 mg by mouth 2 (two) times daily.     [provider]  oxybutynin (DITROPAN-XL) 10 MG 24 hr tablet Take 1 tablet by mouth 2 (two) times daily. 04/23/19   [provider]  pregabalin (LYRICA) 50 MG capsule Take 1 capsule by mouth 2 (two) times daily. 04/24/19   [provider]  warfarin (COUMADIN) 2.5 MG tablet Take 2.5-5 mg by mouth See admin instructions. Take 5 mg by mouth at bedtime on Tuesday. Take 2.5 mg by mouth at bedtime on all other nights of the week.    [provider]  zolpidem (AMBIEN) 10 MG tablet Take 10 mg by mouth at bedtime.     [provider]  metFORMIN (GLUCOPHAGE) 500 MG tablet Take 500 mg by mouth every morning.   01/05/12  [provider]    Family History Family History  Problem Relation Age of Onset  . Heart attack Brother 70       CABG  . Heart disease Mother   . Heart disease Father     Social History Social History   Tobacco Use  . Smoking status: Never Smoker  . Smokeless tobacco: Never Used  Substance Use Topics  . Alcohol use: Yes    Alcohol/week: 14.0 standard drinks    Types: 14 Cans of beer per week  . Drug use: No     Allergies   Patient has no known allergies.   Review of Systems Review of Systems  Constitutional: Negative for chills and fever.  Respiratory: Negative for cough, chest tightness and shortness of breath.   Cardiovascular: Negative for chest pain.  Gastrointestinal: Negative for abdominal pain, nausea and vomiting.  Musculoskeletal: Positive for myalgias (Left lower leg pain and swelling). Negative for arthralgias, back pain, joint swelling and neck pain.  Skin: Negative for color change and wound.  Neurological: Negative for weakness and numbness.     Physical Exam Updated Vital Signs BP (!) 158/94 (BP Location: Right Arm)   Pulse 86   Temp 98.2 F (36.8 C) (Oral)   Resp 16   Ht 5\' 11"  (1.803 m)   Wt 83.9 kg   SpO2 100%   BMI 25.80 kg/m   Physical Exam Vitals signs and nursing note reviewed.  Constitutional:      Appearance: Normal appearance. He is not ill-appearing or toxic-appearing.  HENT:     Head: Atraumatic.  Cardiovascular:     Rate and Rhythm: Normal rate and regular rhythm.     Pulses: Normal pulses.     Comments: DP and PT pulses  are brisk and palpable bilaterally. Pulmonary:     Effort: Pulmonary effort is normal.     Breath sounds: Normal breath sounds.  Chest:     Chest wall: No tenderness.  Abdominal:     General: There is no distension.     Palpations: Abdomen is soft.     Tenderness: There is no abdominal tenderness.  Musculoskeletal: Normal range of motion.        General: Tenderness present.     Left lower leg: He exhibits tenderness. He exhibits no bony tenderness.  Legs:     Comments: Focal area of tenderness and edema to the proximal aspect of the medial left lower leg.  Tenderness over the area of a boggy varicosity.  No excessive warmth or erythema noted.  No posterior calf tenderness, edema or palpable cord.  Skin:    General: Skin is warm.     Capillary Refill: Capillary refill takes less than 2 seconds.     Findings: No erythema or rash.  Neurological:     General: No focal deficit present.     Mental Status: He is alert.     Sensory: No sensory deficit.     Motor: No weakness.      ED Treatments / Results  Labs (all labs ordered are listed, but only abnormal results are displayed) Labs Reviewed - No data to display  EKG None  Radiology No results found.  Procedures Procedures (including critical care time)  Medications Ordered in ED Medications - No data to display   Initial Impression / Assessment and Plan / ED Course  I have reviewed the triage vital signs and the nursing notes.  Pertinent labs & imaging results that were available during my care of the patient were reviewed by me and considered in my medical decision making (see chart for details).        Patient with history of varicosities of the lower extremities.  Neurovascularly intact.  No clinical concerns for cellulitis. No hx of trauma.  No posterior calf tenderness or edema.  Patient is anticoagulated with Coumadin due to a history of A. fib.  Tenderness is likely secondary to a varicosity.  He does  wear compression stockings, I have scheduled him to return tomorrow for outpatient venous imaging of the extremity.  He has an appointment at 9:30 AM.  Patient verbalized understanding of this and agrees to plan.  He was also given referral for vascular surgery.  Final Clinical Impressions(s) / ED Diagnoses   Final diagnoses:  Left leg pain    ED Discharge Orders         Ordered    US Venous Img Lower Unilateral Left     08/02/19 1639           Kem Parkinson, PA-C 08/02/19 1702    Noemi Chapel, MD 08/03/19 1009

## 2019-08-02 NOTE — ED Notes (Signed)
Pt reports 4 days history of pain to swollen area of his L medial lower leg  He has spoken w his  PCP regarding the swelling to his leg and has been encouraged to wear compression hose which he does   He has not spoken to his physician regarding his complaint   Here for eval   Varicose veins present and swelling that appears to be venous to h is medial lower leg

## 2019-08-03 ENCOUNTER — Ambulatory Visit (HOSPITAL_COMMUNITY)
Admission: RE | Admit: 2019-08-03 | Discharge: 2019-08-03 | Disposition: A | Discharge status: Home / Self Care | Payer: Medicare Other | Source: Ambulatory Visit | Attending: Emergency Medicine | Admitting: Emergency Medicine

## 2019-08-03 DIAGNOSIS — Z8679 Personal history of other diseases of the circulatory system: Secondary | ICD-10-CM | POA: Insufficient documentation

## 2019-08-03 DIAGNOSIS — M7989 Other specified soft tissue disorders: Secondary | ICD-10-CM | POA: Diagnosis present

## 2019-08-03 DIAGNOSIS — M79605 Pain in left leg: Secondary | ICD-10-CM | POA: Diagnosis not present

## 2019-08-27 ENCOUNTER — Other Ambulatory Visit: Payer: Self-pay

## 2019-08-27 DIAGNOSIS — I83899 Varicose veins of unspecified lower extremities with other complications: Secondary | ICD-10-CM

## 2019-09-01 ENCOUNTER — Telehealth (HOSPITAL_COMMUNITY): Payer: Self-pay

## 2019-09-01 NOTE — Telephone Encounter (Signed)

## 2019-09-02 ENCOUNTER — Encounter: Payer: Self-pay | Admitting: Vascular Surgery

## 2019-09-02 ENCOUNTER — Ambulatory Visit (INDEPENDENT_AMBULATORY_CARE_PROVIDER_SITE_OTHER): Payer: Medicare Other | Admitting: Vascular Surgery

## 2019-09-02 ENCOUNTER — Other Ambulatory Visit: Payer: Self-pay

## 2019-09-02 ENCOUNTER — Ambulatory Visit (HOSPITAL_COMMUNITY)
Admission: RE | Admit: 2019-09-02 | Discharge: 2019-09-02 | Disposition: A | Discharge status: Home / Self Care | Payer: Medicare Other | Source: Ambulatory Visit | Attending: Vascular Surgery | Admitting: Vascular Surgery

## 2019-09-02 DIAGNOSIS — I83899 Varicose veins of unspecified lower extremities with other complications: Secondary | ICD-10-CM | POA: Diagnosis not present

## 2019-09-02 DIAGNOSIS — I872 Venous insufficiency (chronic) (peripheral): Secondary | ICD-10-CM

## 2019-09-02 NOTE — Progress Notes (Signed)
Patient name: Nicholas Berry MRN: [id number] DOB: 1941/04/16 Sex: male  REASON FOR CONSULT: Evaluate for bilateral venous insufficiency  HPI: Nicholas Berry is a 78 y.o. male, with history of stroke back in the 90s as well as atrial fibrillation on Coumadin, hypertension hyperlipidemia, prostate cancer that presents for evaluation of bilateral lower extremity venous insufficiency.  Patient states he has large varicose veins on both lower extremities for years.  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  States he is try to wear knee-high compression but finds it uncomfortable.  Most of his symptoms are are pain where he has large patches of varicosities on both calves medially right around the knee joint.  These can be really uncomfortable at times.  He tries to elevate his legs when he is not working.  He can only find comfort at night when he elevates his legs with a pillow to try and sleep.  He denies any history of DVT.  Denies any history of trauma.  He feels the left leg is slightly worse.  Past Medical History:  Diagnosis Date  . Anxiety   . Colon polyps   . GERD (gastroesophageal reflux disease)   . History of gout   . Hyperlipidemia   . Hypertension   . Paroxysmal atrial fibrillation (HCC)   . Prostate cancer ([place])   . Spinal stenosis   . Stroke [facility])    1994    Past Surgical History:  Procedure Laterality Date  . BILATERAL DECOMPRESSIVE LAMINECTOMIES & FORAMINOTOMIES L4-L5    . BILATERAL TOTAL HIP REPLACEMENTS    . CATARACT EXTRACTION W/PHACO Right 08/27/2017   Procedure: CATARACT EXTRACTION PHACO AND INTRAOCULAR LENS PLACEMENT (IOC);  Surgeon: Tonny Branch, MD;  Location: [facility];  Service: Ophthalmology;  Laterality: Right;  CDE: 10.52  . CATARACT EXTRACTION W/PHACO Left 10/29/2017   Procedure: CATARACT EXTRACTION PHACO AND INTRAOCULAR LENS PLACEMENT (IOC);  Surgeon: Tonny Branch, MD;  Location: [facility];  Service: Ophthalmology;  Laterality:  Left;  CDE: 9.84  . COLONOSCOPY    . COLONOSCOPY N/A 09/15/2015   Procedure: COLONOSCOPY;  Surgeon: Rogene Houston, MD;  Location: [facility] SUITE;  Service: Endoscopy;  Laterality: N/A;  1030  . PROSTATE SURGERY  2007   cancer-robotic    Family History  Problem Relation Age of Onset  . Heart attack Brother 21       CABG  . Heart disease Mother   . Heart disease Father     SOCIAL HISTORY: Social History   Socioeconomic History  . Marital status: Single    Spouse name: Not on file  . Number of children: Not on file  . Years of education: Not on file  . Highest education level: Not on file  Occupational History  . Not on file  Social Needs  . Financial resource strain: Not on file  . Food insecurity    Worry: Not on file    Inability: Not on file  . Transportation needs    Medical: Not on file    Non-medical: Not on file  Tobacco Use  . Smoking status: Never Smoker  . Smokeless tobacco: Never Used  Substance and Sexual Activity  . Alcohol use: Yes    Alcohol/week: 14.0 standard drinks    Types: 14 Cans of beer per week  . Drug use: No  . Sexual activity: Yes    Birth control/protection: None  Lifestyle  . Physical activity  Days per week: Not on file    Minutes per session: Not on file  . Stress: Not on file  Relationships  . Social Herbalist on phone: Not on file    Gets together: Not on file    Attends religious service: Not on file    Active member of club or organization: Not on file    Attends meetings of clubs or organizations: Not on file    Relationship status: Not on file  . Intimate partner violence    Fear of current or ex partner: Not on file    Emotionally abused: Not on file    Physically abused: Not on file    Forced sexual activity: Not on file  Other Topics Concern  . Not on file  Social History Narrative  . Not on file    No Known Allergies  Current Outpatient Medications  Medication Sig Dispense Refill  .  atorvastatin (LIPITOR) 20 MG tablet Take 20 mg by mouth at bedtime.     . Cyanocobalamin (VITAMIN B-12) 5000 MCG TBDP Take 5,000 mcg by mouth every other day.     . diltiazem (CARDIZEM CD) 240 MG 24 hr capsule Take 240 mg by mouth daily.      . DULoxetine (CYMBALTA) 30 MG capsule Take 30 mg by mouth See admin instructions. Take 30 mg by mouth 5 times weekly    . enalapril (VASOTEC) 20 MG tablet Take 20 mg by mouth 2 (two) times daily.     Marland Kitchen gabapentin (NEURONTIN) 300 MG capsule Take 300 mg by mouth 3 (three) times daily.    . Glucosamine-MSM-Hyaluronic Acd (JOINT HEALTH PO) Take 1 tablet by mouth 2 (two) times daily.    . Multiple Vitamin (MULTIVITAMIN) tablet Take 1 tablet by mouth daily.    . Omega-3 Fatty Acids (FISH OIL) 1200 MG CAPS Take 1,200 mg by mouth 2 (two) times daily.     Marland Kitchen oxybutynin (DITROPAN-XL) 10 MG 24 hr tablet Take 1 tablet by mouth 2 (two) times daily.    . pregabalin (LYRICA) 100 MG capsule     . warfarin (COUMADIN) 2.5 MG tablet Take 2.5-5 mg by mouth See admin instructions. Take 5 mg by mouth at bedtime on Tuesday. Take 2.5 mg by mouth at bedtime on all other nights of the week.    . zolpidem (AMBIEN) 10 MG tablet Take 10 mg by mouth at bedtime.      No current facility-administered medications for this visit.     REVIEW OF SYSTEMS:  [X]  denotes positive finding, [ ]  denotes negative finding Cardiac  Comments:  Chest pain or chest pressure:    Shortness of breath upon exertion:    Short of breath when lying flat:    Irregular heart rhythm:        Vascular    Pain in calf, thigh, or hip brought on by ambulation:    Pain in feet at night that wakes you up from your sleep:     Blood clot in your veins:    Leg swelling:         Pulmonary    Oxygen at home:    Productive cough:     Wheezing:         Neurologic    Sudden weakness in arms or legs:     Sudden numbness in arms or legs:     Sudden onset of difficulty speaking or slurred speech:    Temporary loss  of  vision in one eye:     Problems with dizziness:         Gastrointestinal    Blood in stool:     Vomited blood:         Genitourinary    Burning when urinating:     Blood in urine:        Psychiatric    Major depression:         Hematologic    Bleeding problems:    Problems with blood clotting too easily:        Skin    Rashes or ulcers:        Constitutional    Fever or chills:      PHYSICAL EXAM: Vitals:   09/02/19 1208  BP: 133/88  Pulse: 76  Resp: 20  Temp: 97.7 F (36.5 C)  SpO2: 99%  Weight: 193 lb (87.5 kg)  Height: 5\' 11"  (1.803 m)    GENERAL: The patient is a well-nourished male, in no acute distress. The vital signs are documented above. CARDIAC: There is a regular rate and rhythm.  VASCULAR:  Palpable femoral pulses bilaterally Dorsalis pedis and posterior tibial pulses palpable bilaterally Large varicosities along the medial distal thigh and proximal calf bilaterally Some evidence of skin thickening around the shins. PULMONARY: There is good air exchange bilaterally without wheezing or rales. ABDOMEN: Soft and non-tender with normal pitched bowel sounds.  MUSCULOSKELETAL: There are no major deformities or cyanosis. NEUROLOGIC: No focal weakness or paresthesias are detected. SKIN: There are no ulcers or rashes noted. PSYCHIATRIC: The patient has a normal affect.      DATA:   Lower extremity venous reflux study shows shows abnormal reflux times throughout the great saphenous vein segments bilaterally from the saphenofemoral junction down to the knee. Right great saphenous vein measures 4-9 mm above the knee.  Left great saphenous vein measures 6 -7 mm above the knee.  SSV chronic thrombophlebitis bilaterally.  Deep reflux in CFV bilaterally.    Assessment/Plan:  78 year old male presents with chronic venous insufficiency of his bilateral lower extremities.  CEAP classification C4 given some skin changes in addition to large varicosities on  exam.  Mostly superficial reflux, but some deep reflux as well.   I think he would be a good candidate for venous intervention given significant reflux times throughout the great saphenous veins in both lower extremities and the vein appears rather large and dilated.  His pain is in the distrubution of these large varicosities.  Discussed conservative therapy initially and we measured him for thigh-high compression with 20 or 30 mmHg.  I will have him follow-up 3 months with Dr. Scot Dock or Dr. Oneida Alar to be evaluated for vein intervention which he understands.  Also has evidence of SSV chronic thrombophlebitis, but no symptoms here.    Marty Heck, MD [facility] Office: [phone] Pager: [phone]

## 2019-10-21 DIAGNOSIS — I4891 Unspecified atrial fibrillation: Secondary | ICD-10-CM | POA: Diagnosis not present

## 2019-10-21 DIAGNOSIS — I739 Peripheral vascular disease, unspecified: Secondary | ICD-10-CM | POA: Diagnosis not present

## 2019-10-21 DIAGNOSIS — I10 Essential (primary) hypertension: Secondary | ICD-10-CM | POA: Diagnosis not present

## 2019-10-21 DIAGNOSIS — Z299 Encounter for prophylactic measures, unspecified: Secondary | ICD-10-CM | POA: Diagnosis not present

## 2019-10-21 DIAGNOSIS — D692 Other nonthrombocytopenic purpura: Secondary | ICD-10-CM | POA: Diagnosis not present

## 2019-11-20 DIAGNOSIS — Z299 Encounter for prophylactic measures, unspecified: Secondary | ICD-10-CM | POA: Diagnosis not present

## 2019-11-20 DIAGNOSIS — D6869 Other thrombophilia: Secondary | ICD-10-CM | POA: Diagnosis not present

## 2019-11-20 DIAGNOSIS — I4891 Unspecified atrial fibrillation: Secondary | ICD-10-CM | POA: Diagnosis not present

## 2019-11-20 DIAGNOSIS — I10 Essential (primary) hypertension: Secondary | ICD-10-CM | POA: Diagnosis not present

## 2019-11-20 DIAGNOSIS — N3281 Overactive bladder: Secondary | ICD-10-CM | POA: Diagnosis not present

## 2019-12-10 ENCOUNTER — Telehealth (HOSPITAL_COMMUNITY): Payer: Self-pay

## 2019-12-10 NOTE — Telephone Encounter (Signed)

## 2019-12-11 ENCOUNTER — Encounter: Payer: Self-pay | Admitting: Vascular Surgery

## 2019-12-11 ENCOUNTER — Ambulatory Visit (INDEPENDENT_AMBULATORY_CARE_PROVIDER_SITE_OTHER): Payer: Medicare Other | Admitting: Vascular Surgery

## 2019-12-11 ENCOUNTER — Other Ambulatory Visit: Payer: Self-pay

## 2019-12-11 VITALS — BP 119/75 | HR 87 | Temp 97.0°F | Resp 18 | Ht 71.0 in | Wt 198.6 lb

## 2019-12-11 DIAGNOSIS — I872 Venous insufficiency (chronic) (peripheral): Secondary | ICD-10-CM

## 2019-12-11 DIAGNOSIS — I83813 Varicose veins of bilateral lower extremities with pain: Secondary | ICD-10-CM | POA: Diagnosis not present

## 2019-12-11 NOTE — Progress Notes (Signed)
Patient name: Nicholas Berry MRN: [id number] DOB: 18-Mar-1941 Sex: male  REASON FOR VISIT:   Follow-up of chronic venous insufficiency.  HPI:   Nicholas Berry is a pleasant 79 y.o. male who was seen by Dr. Fortunato Curling on 09/02/2019 with bilateral chronic venous insufficiency.  Of note he has a history of atrial fibrillation and is on Coumadin.  He has a history of venous insufficiency with large varicose veins of both lower extremities.  He was having significant symptoms associated with his varicosities including pain and aching.  His symptoms were relieved with elevation and he also had been wearing knee-high compression stockings.  The left leg symptoms were worse than the right but he was having significant symptoms bilaterally.  On exam he had palpable pedal pulses bilaterally.  He had large varicose veins in the medial distal thigh and proximal calf bilaterally.  He had hyperpigmentation bilaterally.  His duplex scan at that time showed reflux in both great saphenous veins which were significantly dilated.  He also had evidence of chronic thrombophlebitis in the small saphenous veins bilaterally.  He had deep venous reflux in the common common femoral veins bilaterally.  He had CEAP C4 venous disease and was encouraged to wear her thigh-high compression stockings with a gradient of 20 to 30 mmHg, continue to elevate his legs and take ibuprofen as needed for pain.  He comes in for 42-month follow-up visit.  On my history, the patient continues to have significant aching pain and heaviness in both lower extremities which is aggravated by standing and sitting and relieved with elevation.  He also complains of a tired feeling in his leg and complains of bilateral lower extremity swelling.  His symptoms are worse on the left side.  He said no previous venous procedures.  He has no previous history of DVT.  He has been wearing compression stockings and these do not help much.  Elevating his legs does  help.  He takes Advil as needed for pain.  He has a history of atrial fibrillation and is on Coumadin.  His cardiologist is Dr. Domenic Polite.  Past Medical History:  Diagnosis Date  . Anxiety   . Colon polyps   . GERD (gastroesophageal reflux disease)   . History of gout   . Hyperlipidemia   . Hypertension   . Paroxysmal atrial fibrillation (HCC)   . Prostate cancer ([place])   . Spinal stenosis   . Stroke [facility])    1994    Family History  Problem Relation Age of Onset  . Heart attack Brother 67       CABG  . Heart disease Mother   . Heart disease Father     SOCIAL HISTORY: Social History   Tobacco Use  . Smoking status: Never Smoker  . Smokeless tobacco: Never Used  Substance Use Topics  . Alcohol use: Yes    Alcohol/week: 14.0 standard drinks    Types: 14 Cans of beer per week    No Known Allergies  Current Outpatient Medications  Medication Sig Dispense Refill  . atorvastatin (LIPITOR) 20 MG tablet Take 20 mg by mouth at bedtime.     . Cyanocobalamin (VITAMIN B-12) 5000 MCG TBDP Take 5,000 mcg by mouth every other day.     . diltiazem (CARDIZEM CD) 240 MG 24 hr capsule Take 240 mg by mouth daily.      . DULoxetine (CYMBALTA) 30 MG capsule Take 30 mg by mouth See admin instructions. Take 30 mg by  mouth 5 times weekly    . enalapril (VASOTEC) 20 MG tablet Take 20 mg by mouth 2 (two) times daily.     Marland Kitchen gabapentin (NEURONTIN) 300 MG capsule Take 300 mg by mouth 3 (three) times daily.    . Glucosamine-MSM-Hyaluronic Acd (JOINT HEALTH PO) Take 1 tablet by mouth 2 (two) times daily.    . Multiple Vitamin (MULTIVITAMIN) tablet Take 1 tablet by mouth daily.    . Omega-3 Fatty Acids (FISH OIL) 1200 MG CAPS Take 1,200 mg by mouth 2 (two) times daily.     Marland Kitchen oxybutynin (DITROPAN-XL) 10 MG 24 hr tablet Take 1 tablet by mouth 2 (two) times daily.    . pregabalin (LYRICA) 100 MG capsule     . warfarin (COUMADIN) 2.5 MG tablet Take 2.5-5 mg by mouth See admin instructions. Take 2.5 mg  by mouth daily in morning.    . zolpidem (AMBIEN) 10 MG tablet Take 10 mg by mouth at bedtime.      No current facility-administered medications for this visit.    REVIEW OF SYSTEMS:  [X]  denotes positive finding, [ ]  denotes negative finding Cardiac  Comments:  Chest pain or chest pressure:    Shortness of breath upon exertion:    Short of breath when lying flat:    Irregular heart rhythm: x  A. fib      Vascular    Pain in calf, thigh, or hip brought on by ambulation:    Pain in feet at night that wakes you up from your sleep:     Blood clot in your veins:    Leg swelling:         Pulmonary    Oxygen at home:    Productive cough:     Wheezing:         Neurologic    Sudden weakness in arms or legs:     Sudden numbness in arms or legs:     Sudden onset of difficulty speaking or slurred speech:    Temporary loss of vision in one eye:     Problems with dizziness:         Gastrointestinal    Blood in stool:     Vomited blood:         Genitourinary    Burning when urinating:     Blood in urine:        Psychiatric    Major depression:         Hematologic    Bleeding problems:    Problems with blood clotting too easily:        Skin    Rashes or ulcers:        Constitutional    Fever or chills:     PHYSICAL EXAM:   Vitals:   12/11/19 1355  BP: 119/75  Pulse: 87  Resp: 18  Temp: (!) 97 F (36.1 C)  TempSrc: Temporal  SpO2: 97%  Weight: 198 lb 9.6 oz (90.1 kg)  Height: 5\' 11"  (1.803 m)    GENERAL: The patient is a well-nourished male, in no acute distress. The vital signs are documented above. CARDIAC: There is a regular rate and rhythm.  VASCULAR: I do not detect carotid bruits. He has palpable pedal pulses bilaterally. He has significant hyperpigmentation bilaterally.  Is a large cluster varicose veins in his distal right thigh and proximal calf and also a large cluster in his proximal calf on the left.  These veins are under significant  pressure.  I did look at both great saphenous veins myself with the [employer].  He has reflux throughout the great saphenous system bilaterally and the veins are both significantly dilated. PULMONARY: There is good air exchange bilaterally without wheezing or rales. ABDOMEN: Soft and non-tender with normal pitched bowel sounds.  MUSCULOSKELETAL: There are no major deformities or cyanosis. NEUROLOGIC: No focal weakness or paresthesias are detected. SKIN: There are no ulcers or rashes noted. PSYCHIATRIC: The patient has a normal affect.  DATA:    VENOUS DUPLEX: I have reviewed his venous duplex scan that was done on 09/02/2019.  On the left side there was no evidence of DVT.  There was deep venous reflux involving the common femoral vein.  There was superficial venous reflux in the left great saphenous vein down to the knee and the vein was significantly dilated with diameters ranging from 0.62-0.65 cm.  On the right side there was no evidence of DVT.  There was deep venous reflux involving the common femoral vein.  There was superficial venous reflux involving the right great saphenous vein to the distal thigh.  The vein diameters up to 0.55 cm in the proximal thigh.  MEDICAL ISSUES:   CHRONIC VENOUS INSUFFICIENCY WITH PAINFUL VARICOSE VEINS BILATERALLY: The patient has persistent symptoms in both lower extremities related to his venous insufficiency.  He has failed conservative treatment including thigh-high compression stockings with a gradient of 20 to 30 mmHg, leg elevation, and ibuprofen as needed for pain.  I think he would be a good candidate for laser ablation of the left great saphenous vein with 10-20 stabs and then subsequent laser ablation of the right great saphenous vein with greater than 20 stabs.  This would be done in a staged fashion.  I have discussed the indications for endovenous laser ablation of the right GSV, that is to lower the pressure in the veins and potentially  help relieve the symptoms from venous hypertension. I have also discussed alternative options including conservative treatment with leg elevation, compression therapy, exercise, avoiding prolonged sitting and standing, and weight management. I have discussed the potential complications of the procedure, including, but not limited to: bleeding, bruising, leg swelling, nerve injury, skin burns, significant pain from phlebitis, deep venous thrombosis, or failure of the vein to close.  I have also explained that venous insufficiency is a chronic disease, and that the patient is at risk for recurrent varicose veins in the future.  All of the patient's questions were encouraged and answered. They are agreeable to proceed.   I have discussed with the patient the indications for stab phlebectomy.  I have explained to the patient that that will have small scars from the stab incisions.  I explained that the other risks include leg swelling, bruising, bleeding, and phlebitis.  All the patient's questions were encouraged and answered and they are agreeable to proceed.  We will need to hold his Coumadin prior to the procedure and will discuss this with Dr. Domenic Polite.

## 2019-12-18 DIAGNOSIS — Z299 Encounter for prophylactic measures, unspecified: Secondary | ICD-10-CM | POA: Diagnosis not present

## 2019-12-18 DIAGNOSIS — I4891 Unspecified atrial fibrillation: Secondary | ICD-10-CM | POA: Diagnosis not present

## 2019-12-18 DIAGNOSIS — I10 Essential (primary) hypertension: Secondary | ICD-10-CM | POA: Diagnosis not present

## 2019-12-18 DIAGNOSIS — G629 Polyneuropathy, unspecified: Secondary | ICD-10-CM | POA: Diagnosis not present

## 2019-12-23 DIAGNOSIS — M79672 Pain in left foot: Secondary | ICD-10-CM | POA: Diagnosis not present

## 2019-12-23 DIAGNOSIS — L110 Acquired keratosis follicularis: Secondary | ICD-10-CM | POA: Diagnosis not present

## 2019-12-23 DIAGNOSIS — M79671 Pain in right foot: Secondary | ICD-10-CM | POA: Diagnosis not present

## 2019-12-23 DIAGNOSIS — E1140 Type 2 diabetes mellitus with diabetic neuropathy, unspecified: Secondary | ICD-10-CM | POA: Diagnosis not present

## 2019-12-23 DIAGNOSIS — I739 Peripheral vascular disease, unspecified: Secondary | ICD-10-CM | POA: Diagnosis not present

## 2019-12-24 ENCOUNTER — Encounter: Payer: Self-pay | Admitting: Vascular Surgery

## 2019-12-29 ENCOUNTER — Other Ambulatory Visit: Payer: Self-pay | Admitting: *Deleted

## 2019-12-29 DIAGNOSIS — I83813 Varicose veins of bilateral lower extremities with pain: Secondary | ICD-10-CM

## 2020-01-01 ENCOUNTER — Telehealth: Payer: Self-pay | Admitting: *Deleted

## 2020-01-01 NOTE — Telephone Encounter (Signed)
Nicholas Berry is scheduled for endovenous laser ablation L GSV and stab phlebectomy 10-20 incisions by Dr. Gae Gallop on 01-08-2020.  Received Coumadin clearance from Dr. Woody Seller via Joy[facility] Igo. Dr. Woody Seller authorizes Coumadin to be held 5 days prior to surgery, day of surgery, and 2 days post op.  Called Nicholas Berry and reviewed with him to stop Coumadin March 20-27 and may restart Coumadin on March 28.

## 2020-01-08 ENCOUNTER — Encounter: Payer: Self-pay | Admitting: Vascular Surgery

## 2020-01-08 ENCOUNTER — Other Ambulatory Visit: Payer: Self-pay

## 2020-01-08 ENCOUNTER — Ambulatory Visit: Payer: Medicare Other | Admitting: Vascular Surgery

## 2020-01-08 VITALS — BP 133/74 | HR 86 | Temp 97.2°F | Resp 16 | Ht 71.0 in | Wt 196.0 lb

## 2020-01-08 DIAGNOSIS — I872 Venous insufficiency (chronic) (peripheral): Secondary | ICD-10-CM | POA: Diagnosis not present

## 2020-01-08 DIAGNOSIS — I83813 Varicose veins of bilateral lower extremities with pain: Secondary | ICD-10-CM

## 2020-01-08 HISTORY — PX: ENDOVENOUS ABLATION SAPHENOUS VEIN W/ LASER: SUR449

## 2020-01-08 NOTE — Progress Notes (Signed)
Patient name: Nicholas Berry MRN: [id number] DOB: Nov 09, 1940 Sex: male  REASON FOR VISIT: For endovenous laser ablation left great saphenous vein with 10-20 stabs  HPI: Nicholas Berry is a 79 y.o. male who I last saw on 12/11/2019.  He had previously been seen by Dr. Carlis Abbott in November 2020 with bilateral lower extremity chronic venous insufficiency.  He has a history of atrial fibrillation and is on Coumadin.  He had large varicose veins of both lower extremities.  He had CEAP C4 venous disease.  He failed conservative treatment including thigh-high compression stockings with a gradient of 20-30, leg elevation, and ibuprofen as needed for pain.  I felt that he was a good candidate for staged laser ablation of the left great saphenous vein with 10-20 stabs and then subsequently laser ablation of the right great saphenous vein with greater than 20 stabs.  He comes in today for the procedure.  Current Outpatient Medications  Medication Sig Dispense Refill  . atorvastatin (LIPITOR) 20 MG tablet Take 20 mg by mouth at bedtime.     . Cyanocobalamin (VITAMIN B-12) 5000 MCG TBDP Take 5,000 mcg by mouth every other day.     . diltiazem (CARDIZEM CD) 240 MG 24 hr capsule Take 240 mg by mouth daily.      . DULoxetine (CYMBALTA) 30 MG capsule Take 30 mg by mouth See admin instructions. Take 30 mg by mouth 5 times weekly    . enalapril (VASOTEC) 20 MG tablet Take 20 mg by mouth 2 (two) times daily.     Marland Kitchen gabapentin (NEURONTIN) 300 MG capsule Take 300 mg by mouth 3 (three) times daily.    . Glucosamine-MSM-Hyaluronic Acd (JOINT HEALTH PO) Take 1 tablet by mouth 2 (two) times daily.    . Multiple Vitamin (MULTIVITAMIN) tablet Take 1 tablet by mouth daily.    . Omega-3 Fatty Acids (FISH OIL) 1200 MG CAPS Take 1,200 mg by mouth 2 (two) times daily.     Marland Kitchen oxybutynin (DITROPAN-XL) 10 MG 24 hr tablet Take 1 tablet by mouth 2 (two) times daily.    . pregabalin (LYRICA) 100 MG capsule     . zolpidem (AMBIEN) 10 MG  tablet Take 10 mg by mouth at bedtime.     Marland Kitchen warfarin (COUMADIN) 2.5 MG tablet Take 2.5-5 mg by mouth See admin instructions. Take 2.5 mg by mouth daily in morning.     No current facility-administered medications for this visit.    PHYSICAL EXAM: Vitals:   01/08/20 0843  BP: 133/74  Pulse: 86  Resp: 16  Temp: (!) 97.2 F (36.2 C)  TempSrc: Temporal  SpO2: 98%  Weight: 196 lb (88.9 kg)  Height: 5\' 11"  (1.803 m)    PROCEDURE: Laser ablation left great saphenous vein with 10-20 stab phlebectomies  TECHNIQUE: The patient was taken to the exam room and the distended varicose veins were marked with the patient standing.  The patient was then placed supine.  I looked at the great saphenous vein myself and look like I could cannulate it just above the knee before he gave off a large tributary.  Below that it was difficult to follow.  The patient was placed in reverse Trendelenburg.  The left leg was prepped and draped in usual sterile fashion.  Under ultrasound guidance, after the skin was anesthetized, the left great saphenous vein was cannulated with a micropuncture needle and a micropuncture sheath introduced over a wire.  I then advanced the J-wire to just below  the saphenofemoral junction.  The 45 cm sheath was then advanced over the wire and positioned below the saphenofemoral junction.  This was approximately 2 cm distal to the saphenofemoral junction which would be a treatment length of 36 cm.  Next the laser fiber was positioned at the end of the sheath and then the sheath retracted exposing the laser fiber.  Next tumescent anesthesia was administered circumferentially around the vein all the way up to the saphenofemoral junction.  The patient was then placed in Trendelenburg.  Laser ablation of the left great saphenous vein was performed from approximately 2-1/2 to 3 cm distal to the saphenofemoral junction to just above the knee.  36 cm of length was treated, 1471 J of energy were  used.  Next attention was turned to stab phlebectomies.  The areas that were marked were anesthetized with tumescent anesthesia.  Then using approximately 12 small stab incisions the veins were clipped and brought above the skin and then bluntly removed using hemostats.  Pressure was held for hemostasis.  A sterile dressing was applied.  Patient tolerated the procedure well.  He will return in 1 week for follow-up duplex.   Deitra Mayo [facility] [phone]

## 2020-01-08 NOTE — Progress Notes (Signed)
     Laser Ablation Procedure    Date: 01/08/2020   REGAL STANIFER DOB:[birth year]  Consent signed: Yes    Surgeon: Gae Gallop MD  Procedure: Laser Ablation: left Greater Saphenous Vein  BP 133/74 (BP Location: Left Arm, Patient Position: Sitting, Cuff Size: Normal)   Pulse 86   Temp (!) 97.2 F (36.2 C) (Temporal)   Resp 16   Ht 5\' 11"  (1.803 m)   Wt 196 lb (88.9 kg)   SpO2 98%   BMI 27.34 kg/m   Tumescent Anesthesia: 500 cc 0.9% NaCl with 50 cc Lidocaine HCL 1%  and 15 cc 8.4% NaHCO3  Local Anesthesia: 6 cc Lidocaine HCL and NaHCO3 (ratio 2:1)  7 watts continuous mode [employer] 1471 Joules  Treatment Length  36 cm Total Time  210 seconds       Laser Fiber Ref. # [id number]   Lot # [id number]   Stab Phlebectomy: 10-20 Sites: Calf  Patient tolerated procedure well  Notes: Patient wore face mask.  All staff members wore facial masks and facial shields/goggles.  Last dose of Coumadin taken by Mr. Schaal on 01-02-2020.   Description of Procedure:  After marking the course of the secondary varicosities, the patient was placed on the operating table in the supine position, and the left leg was prepped and draped in sterile fashion.   Local anesthetic was administered and under ultrasound guidance the saphenous vein was accessed with a micro needle and guide wire; then the mirco puncture sheath was placed.  A guide wire was inserted saphenofemoral junction , followed by a 5 french sheath.  The position of the sheath and then the laser fiber below the junction was confirmed using the ultrasound.  Tumescent anesthesia was administered along the course of the saphenous vein using ultrasound guidance. The patient was placed in Trendelenburg position and protective laser glasses were placed on patient and staff, and the laser was fired at 7 watts continuous mode for a total of 1471 joules.   For stab phlebectomies, local anesthetic was administered at the previously marked  varicosities, and tumescent anesthesia was administered around the vessels.  Ten to 20 stab wounds were made using the tip of an 11 blade. And using the vein hook, the phlebectomies were performed using a hemostat to avulse the varicosities.  Adequate hemostasis was achieved.     Steri strips were applied to the stab wounds and ABD pads and thigh high compression stockings were applied.  Ace wrap bandages were applied over the phlebectomy sites and at the top of the saphenofemoral junction. Blood loss was less than 15 cc.  Discharge instructions reviewed with patient and hardcopy of discharge instructions given to patient to take home. The patient ambulated out of the operating room having tolerated the procedure well.

## 2020-01-15 ENCOUNTER — Other Ambulatory Visit: Payer: Self-pay

## 2020-01-15 ENCOUNTER — Ambulatory Visit: Payer: Self-pay | Admitting: Vascular Surgery

## 2020-01-15 ENCOUNTER — Encounter: Payer: Self-pay | Admitting: Vascular Surgery

## 2020-01-15 ENCOUNTER — Ambulatory Visit (HOSPITAL_COMMUNITY)
Admission: RE | Admit: 2020-01-15 | Discharge: 2020-01-15 | Disposition: A | Discharge status: Home / Self Care | Payer: Medicare Other | Source: Ambulatory Visit | Attending: Vascular Surgery | Admitting: Vascular Surgery

## 2020-01-15 VITALS — BP 130/82 | HR 76 | Temp 97.4°F | Resp 18 | Ht 71.0 in | Wt 198.8 lb

## 2020-01-15 DIAGNOSIS — I83813 Varicose veins of bilateral lower extremities with pain: Secondary | ICD-10-CM

## 2020-01-15 DIAGNOSIS — I872 Venous insufficiency (chronic) (peripheral): Secondary | ICD-10-CM

## 2020-01-15 NOTE — Progress Notes (Signed)
Patient name: Nicholas Berry MRN: [id number] DOB: 08/02/1941 Sex: male  REASON FOR VISIT: Follow-up after laser ablation of the left great saphenous vein with 10-20 stabs  HPI: Nicholas Berry is a 79 y.o. male with CEAP C4 venous disease who underwent laser ablation of the left great saphenous vein and 10-20 stabs on 01/08/2020.  The left great saphenous vein was ablated from 2-1/2 cm distal to the saphenofemoral junction to just above the knee.  He comes in for a follow-up visit.  He has no specific implants and overall has been doing well.  He states he had minimal bruising.  He is in wearing his thigh-high compression stockings and elevating his legs.  He continues to have symptoms in the right leg and would like to proceed with laser ablation on the right with stab phlebectomies.  Current Outpatient Medications  Medication Sig Dispense Refill  . atorvastatin (LIPITOR) 20 MG tablet Take 20 mg by mouth at bedtime.     . Cyanocobalamin (VITAMIN B-12) 5000 MCG TBDP Take 5,000 mcg by mouth every other day.     . diltiazem (CARDIZEM CD) 240 MG 24 hr capsule Take 240 mg by mouth daily.      . DULoxetine (CYMBALTA) 30 MG capsule Take 30 mg by mouth See admin instructions. Take 30 mg by mouth 5 times weekly    . enalapril (VASOTEC) 20 MG tablet Take 20 mg by mouth 2 (two) times daily.     Marland Kitchen gabapentin (NEURONTIN) 300 MG capsule Take 300 mg by mouth 3 (three) times daily.    . Glucosamine-MSM-Hyaluronic Acd (JOINT HEALTH PO) Take 1 tablet by mouth 2 (two) times daily.    . Multiple Vitamin (MULTIVITAMIN) tablet Take 1 tablet by mouth daily.    . Omega-3 Fatty Acids (FISH OIL) 1200 MG CAPS Take 1,200 mg by mouth 2 (two) times daily.     Marland Kitchen oxybutynin (DITROPAN-XL) 10 MG 24 hr tablet Take 1 tablet by mouth 2 (two) times daily.    . pregabalin (LYRICA) 100 MG capsule     . warfarin (COUMADIN) 2.5 MG tablet Take 2.5-5 mg by mouth See admin instructions. Take 2.5 mg by mouth daily in morning.    .  zolpidem (AMBIEN) 10 MG tablet Take 10 mg by mouth at bedtime.      No current facility-administered medications for this visit.   REVIEW OF SYSTEMS: Valu.Nieves ] denotes positive finding; [  ] denotes negative finding  CARDIOVASCULAR:  [ ]  chest pain   [ ]  dyspnea on exertion  [ ]  leg swelling  CONSTITUTIONAL:  [ ]  fever   [ ]  chills  PHYSICAL EXAM: Vitals:   01/15/20 1042  BP: 130/82  Pulse: 76  Resp: 18  Temp: (!) 97.4 F (36.3 C)  TempSrc: Temporal  SpO2: 97%  Weight: 198 lb 12.8 oz (90.2 kg)  Height: 5\' 11"  (1.803 m)   GENERAL: The patient is a well-nourished male, in no acute distress. The vital signs are documented above. CARDIOVASCULAR: There is a regular rate and rhythm. PULMONARY: There is good air exchange bilaterally without wheezing or rales. VASCULAR: He has no significant bruising in the left thigh.  The stabs are healing nicely. I did look at the right great saphenous vein myself with the SonoSite.  It looks like we could cannulate the vein just above the knee right where it begins to feed some large tributaries.  DATA:  VENOUS DUPLEX: I have independently interpreted his venous duplex scan today.  The left great saphenous vein is closed from 1.4 cm distal to the saphenofemoral junction to just above the knee.  There is no evidence of DVT.  MEDICAL ISSUES:  S/P EVLA LEFT GREAT SAPHENOUS VEIN WITH STAB PHLEBECTOMIES: The patient is doing well and his duplex scan looks good today.  He will continue to wear his thigh-high stockings for another week.  We also discussed the importance of continuing his leg elevation.  He is continuing to have symptoms in the right leg and therefore we will schedule him for laser ablation of the right great saphenous vein with greater than 20 stab phlebectomies.  Deitra Mayo [facility] [phone]

## 2020-01-18 ENCOUNTER — Encounter: Payer: Self-pay | Admitting: Cardiology

## 2020-01-18 NOTE — Progress Notes (Signed)
Virtual Visit via Telephone Note   This visit type was conducted due to national recommendations for restrictions regarding the COVID-19 Pandemic (e.g. social distancing) in an effort to limit this patient's exposure and mitigate transmission in our community.  Due to his co-morbid illnesses, this patient is at least at moderate risk for complications without adequate follow up.  This format is felt to be most appropriate for this patient at this time.  The patient did not have access to video technology/had technical difficulties with video requiring transitioning to audio format only (telephone).  All issues noted in this document were discussed and addressed.  No physical exam could be performed with this format.  Please refer to the patient's chart for his  consent to telehealth for [facility].   The patient was identified using 2 identifiers.  Date:  01/19/2020   ID:  Nicholas Berry, DOB Oct 02, 1941, MRN [id number]  Patient Location: Home Provider Location: Office  PCP:  Glenda Chroman, MD  Cardiologist:  Rozann Lesches, MD Electrophysiologist:  None   Evaluation Performed:  Follow-Up Visit  Chief Complaint:  Cardiac follow-up  History of Present Illness:    Nicholas Berry is a 79 y.o. male last seen in August 2020.  We spoke by phone today.  He continues to do well from a cardiac perspective, no palpitations or chest pain.  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  He remains on Coumadin, followed by Dr. Woody Seller.  He does not report any spontaneous bleeding problems.  Also anticipates a physical with Dr. Woody Seller this Thursday.  He is following with Dr. Scot Dock, appointment with [facility] noted recently in April.  Currently being treated for lower extremity venous disease, status post laser ablation and stab phlebotomies.  States that he feels much better, less leg pain.  The patient does not have symptoms concerning for COVID-19 infection (fever, chills, cough, or new  shortness of breath).    Past Medical History:  Diagnosis Date  . Anxiety   . Colon polyps   . Essential hypertension   . GERD (gastroesophageal reflux disease)   . History of gout   . Hyperlipidemia   . Paroxysmal atrial fibrillation (HCC)   . Prostate cancer ([place])   . Spinal stenosis   . Stroke [facility])    1994   Past Surgical History:  Procedure Laterality Date  . BILATERAL DECOMPRESSIVE LAMINECTOMIES & FORAMINOTOMIES L4-L5    . BILATERAL TOTAL HIP REPLACEMENTS    . CATARACT EXTRACTION W/PHACO Right 08/27/2017   Procedure: CATARACT EXTRACTION PHACO AND INTRAOCULAR LENS PLACEMENT (IOC);  Surgeon: Tonny Branch, MD;  Location: [facility]S;  Service: Ophthalmology;  Laterality: Right;  CDE: 10.52  . CATARACT EXTRACTION W/PHACO Left 10/29/2017   Procedure: CATARACT EXTRACTION PHACO AND INTRAOCULAR LENS PLACEMENT (IOC);  Surgeon: Tonny Branch, MD;  Location: [facility];  Service: Ophthalmology;  Laterality: Left;  CDE: 9.84  . COLONOSCOPY    . COLONOSCOPY N/A 09/15/2015   Procedure: COLONOSCOPY;  Surgeon: Rogene Houston, MD;  Location: [facility] SUITE;  Service: Endoscopy;  Laterality: N/A;  1030  . ENDOVENOUS ABLATION SAPHENOUS VEIN W/ LASER Left 01/08/2020   endovenous laser ablation left greater saphenous vein and stab phlebectomy 10-20 incisions left leg by Gae Gallop MD   . PROSTATE SURGERY  2007   cancer-robotic     Current Meds  Medication Sig  . atorvastatin (LIPITOR) 20 MG tablet Take 20 mg by mouth at bedtime.   Marland Kitchen diltiazem (CARDIZEM  CD) 240 MG 24 hr capsule Take 240 mg by mouth daily.    . DULoxetine (CYMBALTA) 30 MG capsule Take 30 mg by mouth See admin instructions. Take 30 mg by mouth 5 times weekly  . enalapril (VASOTEC) 20 MG tablet Take 20 mg by mouth daily.   . Glucosamine-MSM-Hyaluronic Acd (JOINT HEALTH PO) Take 1 tablet by mouth 2 (two) times daily.  . Multiple Vitamin (MULTIVITAMIN) tablet Take 1 tablet by mouth daily.  . Omega-3 Fatty Acids (FISH OIL) 1200 MG CAPS  Take 1,200 mg by mouth 2 (two) times daily.   Marland Kitchen oxybutynin (DITROPAN XL) 15 MG 24 hr tablet Take 15 mg by mouth at bedtime.  . pregabalin (LYRICA) 150 MG capsule Take 150 mg by mouth 2 (two) times daily.  Marland Kitchen warfarin (COUMADIN) 2.5 MG tablet Take 2.5-5 mg by mouth See admin instructions. Take 2.5 mg by mouth daily in morning.  . zolpidem (AMBIEN) 10 MG tablet Take 10 mg by mouth at bedtime.   . [DISCONTINUED] oxybutynin (DITROPAN-XL) 10 MG 24 hr tablet Take 10 mg by mouth at bedtime.   . [DISCONTINUED] pregabalin (LYRICA) 100 MG capsule 100 mg 2 (two) times daily.      Allergies:   Patient has no known allergies.   ROS:  No dizziness or syncope.   Prior CV studies:   The following studies were reviewed today:  No interval cardiac testing for review today.  Labs/Other Tests and Data Reviewed:    EKG:  An ECG dated 11/13/2018 was personally reviewed today and demonstrated:  Atrial fibrillation with lead motion artifact.  Recent Labs:  No interval lab work for review today.  Wt Readings from Last 3 Encounters:  01/19/20 192 lb (87.1 kg)  01/15/20 198 lb 12.8 oz (90.2 kg)  01/08/20 196 lb (88.9 kg)     Objective:    Vital Signs:  BP 124/77   Pulse 76   Ht 5\' 11"  (1.803 m)   Wt 192 lb (87.1 kg)   BMI 26.78 kg/m    Patient spoke in full sentences, not short of breath. No audible wheezing or coughing.  ASSESSMENT & PLAN:    1.  Permanent atrial fibrillation, CHA2DS2-VASc score is 5.  He continues on Coumadin for stroke prophylaxis with follow-up by Dr. Woody Seller.  No reported bleeding problems.  Continue Cardizem CD for heart rate control.  2.  Essential hypertension, blood pressure is well controlled today.  He is also on Vasotec.   Time:   Today, I have spent 7 minutes with the patient with telehealth technology discussing the above problems.     Medication Adjustments/Labs and Tests Ordered: Current medicines are reviewed at length with the patient today.  Concerns  regarding medicines are outlined above.   Tests Ordered: No orders of the defined types were placed in this encounter.   Medication Changes: No orders of the defined types were placed in this encounter.   Follow Up:  In Person 6 months in the [place] office.  Signed, Rozann Lesches, MD  01/19/2020 8:25 AM    [place]

## 2020-01-19 ENCOUNTER — Other Ambulatory Visit: Payer: Self-pay

## 2020-01-19 ENCOUNTER — Telehealth (INDEPENDENT_AMBULATORY_CARE_PROVIDER_SITE_OTHER): Payer: Medicare Other | Admitting: Cardiology

## 2020-01-19 ENCOUNTER — Encounter: Payer: Self-pay | Admitting: Cardiology

## 2020-01-19 VITALS — BP 124/77 | HR 76 | Ht 71.0 in | Wt 192.0 lb

## 2020-01-19 DIAGNOSIS — I10 Essential (primary) hypertension: Secondary | ICD-10-CM

## 2020-01-19 DIAGNOSIS — I4821 Permanent atrial fibrillation: Secondary | ICD-10-CM

## 2020-01-19 NOTE — Patient Instructions (Signed)

## 2020-01-20 ENCOUNTER — Other Ambulatory Visit: Payer: Self-pay | Admitting: *Deleted

## 2020-01-20 DIAGNOSIS — I83813 Varicose veins of bilateral lower extremities with pain: Secondary | ICD-10-CM

## 2020-01-22 DIAGNOSIS — R5383 Other fatigue: Secondary | ICD-10-CM | POA: Diagnosis not present

## 2020-01-22 DIAGNOSIS — E7800 Pure hypercholesterolemia, unspecified: Secondary | ICD-10-CM | POA: Diagnosis not present

## 2020-01-22 DIAGNOSIS — I10 Essential (primary) hypertension: Secondary | ICD-10-CM | POA: Diagnosis not present

## 2020-01-22 DIAGNOSIS — Z1211 Encounter for screening for malignant neoplasm of colon: Secondary | ICD-10-CM | POA: Diagnosis not present

## 2020-01-22 DIAGNOSIS — Z0000 Encounter for general adult medical examination without abnormal findings: Secondary | ICD-10-CM | POA: Diagnosis not present

## 2020-01-22 DIAGNOSIS — Z299 Encounter for prophylactic measures, unspecified: Secondary | ICD-10-CM | POA: Diagnosis not present

## 2020-01-22 DIAGNOSIS — Z79899 Other long term (current) drug therapy: Secondary | ICD-10-CM | POA: Diagnosis not present

## 2020-01-22 DIAGNOSIS — Z7189 Other specified counseling: Secondary | ICD-10-CM | POA: Diagnosis not present

## 2020-01-27 DIAGNOSIS — M25552 Pain in left hip: Secondary | ICD-10-CM | POA: Diagnosis not present

## 2020-01-27 DIAGNOSIS — Z299 Encounter for prophylactic measures, unspecified: Secondary | ICD-10-CM | POA: Diagnosis not present

## 2020-02-05 DIAGNOSIS — Z299 Encounter for prophylactic measures, unspecified: Secondary | ICD-10-CM | POA: Diagnosis not present

## 2020-02-05 DIAGNOSIS — R21 Rash and other nonspecific skin eruption: Secondary | ICD-10-CM | POA: Diagnosis not present

## 2020-02-05 DIAGNOSIS — D6869 Other thrombophilia: Secondary | ICD-10-CM | POA: Diagnosis not present

## 2020-02-05 DIAGNOSIS — N3281 Overactive bladder: Secondary | ICD-10-CM | POA: Diagnosis not present

## 2020-02-05 DIAGNOSIS — I4891 Unspecified atrial fibrillation: Secondary | ICD-10-CM | POA: Diagnosis not present

## 2020-02-05 DIAGNOSIS — I10 Essential (primary) hypertension: Secondary | ICD-10-CM | POA: Diagnosis not present

## 2020-03-04 ENCOUNTER — Encounter: Payer: Self-pay | Admitting: Vascular Surgery

## 2020-03-04 ENCOUNTER — Ambulatory Visit: Payer: Medicare Other | Admitting: Vascular Surgery

## 2020-03-04 ENCOUNTER — Other Ambulatory Visit: Payer: Self-pay

## 2020-03-04 VITALS — BP 136/85 | Temp 97.3°F | Resp 16 | Ht 71.0 in | Wt 189.0 lb

## 2020-03-04 DIAGNOSIS — I83813 Varicose veins of bilateral lower extremities with pain: Secondary | ICD-10-CM

## 2020-03-04 DIAGNOSIS — I872 Venous insufficiency (chronic) (peripheral): Secondary | ICD-10-CM

## 2020-03-04 HISTORY — PX: ENDOVENOUS ABLATION SAPHENOUS VEIN W/ LASER: SUR449

## 2020-03-04 NOTE — Progress Notes (Signed)
     Laser Ablation Procedure    Date: 03/04/2020   Nicholas Berry DOB:08-11-1941  Consent signed: Yes    Surgeon: Gae Gallop MD   Procedure: Laser Ablation: right Greater Saphenous Vein  BP 136/85 (BP Location: Left Arm, Patient Position: Sitting, Cuff Size: Large)   Temp (!) 97.3 F (36.3 C) (Temporal)   Resp 16   Ht 5\' 11"  (1.803 m)   Wt 189 lb (85.7 kg)   SpO2 97%   BMI 26.36 kg/m   Tumescent Anesthesia: 450 cc 0.9% NaCl with 50 cc Lidocaine HCL 1%  and 15 cc 8.4% NaHCO3  Local Anesthesia: 5 cc Lidocaine HCL and NaHCO3 (ratio 2:1)  7 watts continuous mode     Total energy: 1392 Joules   Total time: 198 seconds Treatment Length 30 cm  Laser Fiber Ref. # [id number]      Lot # [id number]   Stab Phlebectomy: >20 Sites: Thigh and Calf  Patient tolerated procedure well  Notes: Patient wore face mask.  All staff members wore facial masks and facial shields/goggles. Last dose Coumadin 02-27-2020.     Description of Procedure:  After marking the course of the secondary varicosities, the patient was placed on the operating table in the supine position, and the right leg was prepped and draped in sterile fashion.   Local anesthetic was administered and under ultrasound guidance the saphenous vein was accessed with a micro needle and guide wire; then the mirco puncture sheath was placed.  A guide wire was inserted saphenofemoral junction , followed by a 5 french sheath.  The position of the sheath and then the laser fiber below the junction was confirmed using the ultrasound.  Tumescent anesthesia was administered along the course of the saphenous vein using ultrasound guidance. The patient was placed in Trendelenburg position and protective laser glasses were placed on patient and staff, and the laser was fired at 7 watts continuous mode  for a total of 1392 joules.   For stab phlebectomies, local anesthetic was administered at the previously marked varicosities, and tumescent  anesthesia was administered around the vessels.  Greater than 20 stab wounds were made using the tip of an 11 blade. And using the vein hook, the phlebectomies were performed using a hemostat to avulse the varicosities.  Adequate hemostasis was achieved.     Steri strips were applied to the stab wounds and ABD pads and thigh high compression stockings were applied.  Ace wrap bandages were applied over the phlebectomy sites and at the top of the saphenofemoral junction. Blood loss was less than 15 cc.  Discharge instructions reviewed with patient and hardcopy of discharge instructions given to patient to take home. The patient ambulated out of the operating room having tolerated the procedure well.

## 2020-03-04 NOTE — Progress Notes (Signed)
Patient name: Nicholas Berry MRN: [id number] DOB: [birth year] Sex: male  REASON FOR VISIT: For laser ablation of the right great saphenous vein with greater than 20 stabs  HPI: Nicholas Berry is a 79 y.o. male who underwent laser ablation of the left great saphenous vein with 10-20 stabs on 01/08/2020.  He did well after that but continues to have symptoms in the right leg despite conservative measures including leg elevation, thigh-high compression stockings, and ibuprofen as needed for pain.  He presents for laser ablation of the right great saphenous vein with greater than 20 stabs.  Current Outpatient Medications  Medication Sig Dispense Refill  . atorvastatin (LIPITOR) 20 MG tablet Take 20 mg by mouth at bedtime.     Marland Kitchen diltiazem (CARDIZEM CD) 240 MG 24 hr capsule Take 240 mg by mouth daily.      . DULoxetine (CYMBALTA) 30 MG capsule Take 30 mg by mouth See admin instructions. Take 30 mg by mouth 5 times weekly    . enalapril (VASOTEC) 20 MG tablet Take 20 mg by mouth daily.     . Glucosamine-MSM-Hyaluronic Acd (JOINT HEALTH PO) Take 1 tablet by mouth 2 (two) times daily.    . Multiple Vitamin (MULTIVITAMIN) tablet Take 1 tablet by mouth daily.    . Omega-3 Fatty Acids (FISH OIL) 1200 MG CAPS Take 1,200 mg by mouth 2 (two) times daily.     Marland Kitchen oxybutynin (DITROPAN XL) 15 MG 24 hr tablet Take 15 mg by mouth at bedtime.    . pregabalin (LYRICA) 150 MG capsule Take 150 mg by mouth 2 (two) times daily.    Marland Kitchen zolpidem (AMBIEN) 10 MG tablet Take 10 mg by mouth at bedtime.     Marland Kitchen warfarin (COUMADIN) 2.5 MG tablet Take 2.5-5 mg by mouth See admin instructions. Take 2.5 mg by mouth daily in morning.     No current facility-administered medications for this visit.    PHYSICAL EXAM: Vitals:   03/04/20 0830  BP: 136/85  Resp: 16  Temp: (!) 97.3 F (36.3 C)  TempSrc: Temporal  SpO2: 97%  Weight: 189 lb (85.7 kg)  Height: 5\' 11"  (1.803 m)    PROCEDURE: Laser ablation of the right great  saphenous vein with greater than 20 stab phlebectomies  TECHNIQUE: The patient was taken to the exam room.  With the patient standing the dilated veins which were under significant pressure were marked.  The patient was then placed supine.  I then looked at the right great saphenous vein with the SonoSite and felt that I could cannulate this just above the knee.  The right leg was prepped and draped in usual sterile fashion.  Under ultrasound guidance, after the skin was anesthetized, I cannulated the great saphenous vein just above the knee above the takeoff of the large cluster of varicose veins.  A micropuncture sheath was introduced over a wire.  I then advanced the J-wire to just below the saphenofemoral junction.  A 45 cm sheath was then advanced over the wire and positioned below the saphenofemoral junction.  The dilator and wire were removed.  The laser fiber was then positioned at the end of the sheath and then the sheath retracted to expose the laser fiber.  This was positioned 2-1/2 cm distal to the saphenofemoral junction.  Measurement was obtained.  Next tumescent anesthesia was administered circumferentially around the vein from the point of cannulation up to the saphenofemoral junction.  The patient was then placed in Trendelenburg.  Laser goggles were placed and then laser ablation was performed of the right great saphenous vein from 2-1/2 cm distal to the saphenofemoral junction to above the knee.  30 cm of vein were treated.  1392 J were used.  We used 50 J/cm at 7 W.  Next attention was turned to stab phlebectomies.  Tumescent anesthesia was administered under all the marked areas.  Approximately 25 stab incisions were made over all the marked areas and using the hook the veins were retracted above the skin and then grasped with a hemostat and bluntly excised.  Pressure was held for hemostasis.  Sterile dressing was applied.  Patient tolerated the procedure well and will return in 1 week for  follow-up duplex.  Deitra Mayo [facility] [place] [phone]

## 2020-03-09 DIAGNOSIS — Z299 Encounter for prophylactic measures, unspecified: Secondary | ICD-10-CM | POA: Diagnosis not present

## 2020-03-09 DIAGNOSIS — I4891 Unspecified atrial fibrillation: Secondary | ICD-10-CM | POA: Diagnosis not present

## 2020-03-09 DIAGNOSIS — I10 Essential (primary) hypertension: Secondary | ICD-10-CM | POA: Diagnosis not present

## 2020-03-11 ENCOUNTER — Other Ambulatory Visit: Payer: Self-pay

## 2020-03-11 ENCOUNTER — Encounter: Payer: Self-pay | Admitting: Vascular Surgery

## 2020-03-11 ENCOUNTER — Ambulatory Visit (HOSPITAL_COMMUNITY)
Admission: RE | Admit: 2020-03-11 | Discharge: 2020-03-11 | Disposition: A | Discharge status: Home / Self Care | Payer: Medicare Other | Source: Ambulatory Visit | Attending: Vascular Surgery | Admitting: Vascular Surgery

## 2020-03-11 ENCOUNTER — Ambulatory Visit (INDEPENDENT_AMBULATORY_CARE_PROVIDER_SITE_OTHER): Payer: Self-pay | Admitting: Vascular Surgery

## 2020-03-11 VITALS — BP 113/68 | HR 78 | Resp 18 | Ht 71.0 in | Wt 189.0 lb

## 2020-03-11 DIAGNOSIS — I83813 Varicose veins of bilateral lower extremities with pain: Secondary | ICD-10-CM

## 2020-03-11 NOTE — Progress Notes (Signed)
Patient name: Nicholas Berry MRN: [id number] DOB: [birth year] Sex: male  REASON FOR VISIT: Follow-up after laser ablation of the right great saphenous vein with greater than 20 stabs  HPI: Nicholas Berry is a 79 y.o. male who underwent laser ablation of the left great saphenous vein and 10-20 stabs on 01/08/2020.  He subsequently presented for laser ablation of the right great saphenous vein with greater than 20 stabs which was done on 03/04/2020.  His right great saphenous vein was ablated from distal to the saphenofemoral junction to just above the knee.  He comes in for routine follow-up visit.  He is doing well today and has no specific complaints.  CONSERVATIVE TREATMENT: He has been using an Ace bandage for compression as he does not tolerate the thigh-high stockings.  He has been elevating his legs.  VENOUS INTERVENTIONS: He has now had both lower extremities addressed.  Current Outpatient Medications  Medication Sig Dispense Refill  . atorvastatin (LIPITOR) 20 MG tablet Take 20 mg by mouth at bedtime.     Marland Kitchen diltiazem (CARDIZEM CD) 240 MG 24 hr capsule Take 240 mg by mouth daily.      . DULoxetine (CYMBALTA) 30 MG capsule Take 30 mg by mouth See admin instructions. Take 30 mg by mouth 5 times weekly    . enalapril (VASOTEC) 20 MG tablet Take 20 mg by mouth daily.     . Glucosamine-MSM-Hyaluronic Acd (JOINT HEALTH PO) Take 1 tablet by mouth 2 (two) times daily.    . Multiple Vitamin (MULTIVITAMIN) tablet Take 1 tablet by mouth daily.    . Omega-3 Fatty Acids (FISH OIL) 1200 MG CAPS Take 1,200 mg by mouth 2 (two) times daily.     Marland Kitchen oxybutynin (DITROPAN XL) 15 MG 24 hr tablet Take 15 mg by mouth at bedtime.    . pregabalin (LYRICA) 150 MG capsule Take 150 mg by mouth 2 (two) times daily.    Marland Kitchen warfarin (COUMADIN) 2.5 MG tablet Take 2.5-5 mg by mouth See admin instructions. Take 2.5 mg by mouth daily in morning.    . zolpidem (AMBIEN) 10 MG tablet Take 10 mg by mouth at bedtime.      No  current facility-administered medications for this visit.   REVIEW OF SYSTEMS: Valu.Nieves ] denotes positive finding; [  ] denotes negative finding  CARDIOVASCULAR:  [ ]  chest pain   [ ]  dyspnea on exertion  [ ]  leg swelling  CONSTITUTIONAL:  [ ]  fever   [ ]  chills  PHYSICAL EXAM: Vitals:   03/11/20 1028  BP: 113/68  Pulse: 78  Resp: 18  SpO2: 98%  Weight: 189 lb (85.7 kg)  Height: 5\' 11"  (1.803 m)   GENERAL: The patient is a well-nourished male, in no acute distress. The vital signs are documented above. CARDIOVASCULAR: There is a regular rate and rhythm. PULMONARY: There is good air exchange bilaterally without wheezing or rales. VASCULAR: He has some mild bruising in the distal thigh.  His incisions are healing nicely.  He has no significant lower extremity swelling bilaterally.  His incisions on the left side have healed.  DATA:  VENOUS DUPLEX: I have independently interpreted his venous duplex scan today.  There is no evidence of DVT.  The right great saphenous vein is closed from the saphenofemoral junction to just above the knee.  MEDICAL ISSUES:  STATUS POST STAGED LASER ABLATION OF THE SAPHENOUS VEINS AND STAB PHLEBECTOMIES: The patient is doing well status post staged laser ablation of  the saphenous veins and stab phlebectomies.  He will continue to use compression dressings for another week and then he can use them as needed.  He will also elevate his legs.  I encouraged him to resume his normal activities.  I will see him back as needed.  Deitra Mayo [facility] [place] [phone]

## 2020-03-23 DIAGNOSIS — E1140 Type 2 diabetes mellitus with diabetic neuropathy, unspecified: Secondary | ICD-10-CM | POA: Diagnosis not present

## 2020-03-23 DIAGNOSIS — M79672 Pain in left foot: Secondary | ICD-10-CM | POA: Diagnosis not present

## 2020-03-23 DIAGNOSIS — I739 Peripheral vascular disease, unspecified: Secondary | ICD-10-CM | POA: Diagnosis not present

## 2020-03-23 DIAGNOSIS — L110 Acquired keratosis follicularis: Secondary | ICD-10-CM | POA: Diagnosis not present

## 2020-03-23 DIAGNOSIS — M79671 Pain in right foot: Secondary | ICD-10-CM | POA: Diagnosis not present

## 2020-04-05 DIAGNOSIS — I4891 Unspecified atrial fibrillation: Secondary | ICD-10-CM | POA: Diagnosis not present

## 2020-04-05 DIAGNOSIS — Z299 Encounter for prophylactic measures, unspecified: Secondary | ICD-10-CM | POA: Diagnosis not present

## 2020-04-05 DIAGNOSIS — I10 Essential (primary) hypertension: Secondary | ICD-10-CM | POA: Diagnosis not present

## 2020-04-05 DIAGNOSIS — D6869 Other thrombophilia: Secondary | ICD-10-CM | POA: Diagnosis not present

## 2020-05-05 DIAGNOSIS — Z299 Encounter for prophylactic measures, unspecified: Secondary | ICD-10-CM | POA: Diagnosis not present

## 2020-05-05 DIAGNOSIS — I739 Peripheral vascular disease, unspecified: Secondary | ICD-10-CM | POA: Diagnosis not present

## 2020-05-05 DIAGNOSIS — I4891 Unspecified atrial fibrillation: Secondary | ICD-10-CM | POA: Diagnosis not present

## 2020-05-05 DIAGNOSIS — I10 Essential (primary) hypertension: Secondary | ICD-10-CM | POA: Diagnosis not present

## 2020-05-05 DIAGNOSIS — D6869 Other thrombophilia: Secondary | ICD-10-CM | POA: Diagnosis not present

## 2020-05-13 DIAGNOSIS — L89892 Pressure ulcer of other site, stage 2: Secondary | ICD-10-CM | POA: Diagnosis not present

## 2020-05-13 DIAGNOSIS — M79672 Pain in left foot: Secondary | ICD-10-CM | POA: Diagnosis not present

## 2020-05-13 DIAGNOSIS — E7800 Pure hypercholesterolemia, unspecified: Secondary | ICD-10-CM | POA: Diagnosis not present

## 2020-05-13 DIAGNOSIS — I10 Essential (primary) hypertension: Secondary | ICD-10-CM | POA: Diagnosis not present

## 2020-05-13 DIAGNOSIS — Z299 Encounter for prophylactic measures, unspecified: Secondary | ICD-10-CM | POA: Diagnosis not present

## 2020-05-13 DIAGNOSIS — E1140 Type 2 diabetes mellitus with diabetic neuropathy, unspecified: Secondary | ICD-10-CM | POA: Diagnosis not present

## 2020-05-13 DIAGNOSIS — I4891 Unspecified atrial fibrillation: Secondary | ICD-10-CM | POA: Diagnosis not present

## 2020-05-13 DIAGNOSIS — M79671 Pain in right foot: Secondary | ICD-10-CM | POA: Diagnosis not present

## 2020-05-13 DIAGNOSIS — Z96643 Presence of artificial hip joint, bilateral: Secondary | ICD-10-CM | POA: Diagnosis not present

## 2020-05-13 DIAGNOSIS — L110 Acquired keratosis follicularis: Secondary | ICD-10-CM | POA: Diagnosis not present

## 2020-05-27 DIAGNOSIS — L110 Acquired keratosis follicularis: Secondary | ICD-10-CM | POA: Diagnosis not present

## 2020-05-27 DIAGNOSIS — E1140 Type 2 diabetes mellitus with diabetic neuropathy, unspecified: Secondary | ICD-10-CM | POA: Diagnosis not present

## 2020-05-27 DIAGNOSIS — M79671 Pain in right foot: Secondary | ICD-10-CM | POA: Diagnosis not present

## 2020-05-27 DIAGNOSIS — M79672 Pain in left foot: Secondary | ICD-10-CM | POA: Diagnosis not present

## 2020-05-27 DIAGNOSIS — L89892 Pressure ulcer of other site, stage 2: Secondary | ICD-10-CM | POA: Diagnosis not present

## 2020-06-01 DIAGNOSIS — E1140 Type 2 diabetes mellitus with diabetic neuropathy, unspecified: Secondary | ICD-10-CM | POA: Diagnosis not present

## 2020-06-01 DIAGNOSIS — M79672 Pain in left foot: Secondary | ICD-10-CM | POA: Diagnosis not present

## 2020-06-01 DIAGNOSIS — L89892 Pressure ulcer of other site, stage 2: Secondary | ICD-10-CM | POA: Diagnosis not present

## 2020-06-01 DIAGNOSIS — L110 Acquired keratosis follicularis: Secondary | ICD-10-CM | POA: Diagnosis not present

## 2020-06-01 DIAGNOSIS — M79671 Pain in right foot: Secondary | ICD-10-CM | POA: Diagnosis not present

## 2020-06-07 DIAGNOSIS — Z299 Encounter for prophylactic measures, unspecified: Secondary | ICD-10-CM | POA: Diagnosis not present

## 2020-06-07 DIAGNOSIS — I10 Essential (primary) hypertension: Secondary | ICD-10-CM | POA: Diagnosis not present

## 2020-06-07 DIAGNOSIS — D6869 Other thrombophilia: Secondary | ICD-10-CM | POA: Diagnosis not present

## 2020-06-07 DIAGNOSIS — I4891 Unspecified atrial fibrillation: Secondary | ICD-10-CM | POA: Diagnosis not present

## 2020-06-16 DIAGNOSIS — L110 Acquired keratosis follicularis: Secondary | ICD-10-CM | POA: Diagnosis not present

## 2020-06-16 DIAGNOSIS — E1140 Type 2 diabetes mellitus with diabetic neuropathy, unspecified: Secondary | ICD-10-CM | POA: Diagnosis not present

## 2020-06-16 DIAGNOSIS — M79671 Pain in right foot: Secondary | ICD-10-CM | POA: Diagnosis not present

## 2020-06-16 DIAGNOSIS — L89892 Pressure ulcer of other site, stage 2: Secondary | ICD-10-CM | POA: Diagnosis not present

## 2020-06-16 DIAGNOSIS — M79672 Pain in left foot: Secondary | ICD-10-CM | POA: Diagnosis not present

## 2020-07-08 DIAGNOSIS — E1140 Type 2 diabetes mellitus with diabetic neuropathy, unspecified: Secondary | ICD-10-CM | POA: Diagnosis not present

## 2020-07-08 DIAGNOSIS — M79672 Pain in left foot: Secondary | ICD-10-CM | POA: Diagnosis not present

## 2020-07-08 DIAGNOSIS — L89892 Pressure ulcer of other site, stage 2: Secondary | ICD-10-CM | POA: Diagnosis not present

## 2020-07-08 DIAGNOSIS — Z299 Encounter for prophylactic measures, unspecified: Secondary | ICD-10-CM | POA: Diagnosis not present

## 2020-07-08 DIAGNOSIS — I10 Essential (primary) hypertension: Secondary | ICD-10-CM | POA: Diagnosis not present

## 2020-07-08 DIAGNOSIS — L110 Acquired keratosis follicularis: Secondary | ICD-10-CM | POA: Diagnosis not present

## 2020-07-08 DIAGNOSIS — M79671 Pain in right foot: Secondary | ICD-10-CM | POA: Diagnosis not present

## 2020-07-08 DIAGNOSIS — I4891 Unspecified atrial fibrillation: Secondary | ICD-10-CM | POA: Diagnosis not present

## 2020-07-23 ENCOUNTER — Ambulatory Visit: Payer: Medicare Other | Admitting: Cardiology

## 2020-07-28 NOTE — Progress Notes (Deleted)
Cardiology Office Note  Date: 07/28/2020   ID: Nicholas Berry, DOB November 26, 1940, MRN [id number]  PCP:  Glenda Chroman, MD  Cardiologist:  Rozann Lesches, MD Electrophysiologist:  None   Chief Complaint: Follow-up permanent atrial fibrillation.  History of Present Illness: Nicholas Berry is a 79 y.o. male with a history of permanent atrial fibrillation, HTN, HLD, CVA, venous insufficiency.  Last encounter with Dr. Domenic Polite via telemedicine on 01/19/2020.  He was doing well from a cardiac perspective with no palpitations or chest pain.  He remained on Coumadin and reported no bleeding issues.  Following with Dr. Doren Custard for lower extremity venous disease.Marland Kitchen  His CHA2DS2-VASc score was 5.  He continued on Coumadin for stroke prophylaxis.  He was continuing Cardizem CD for rate control.  His blood pressure was well controlled he was continuing Vasotec.   Past Medical History:  Diagnosis Date  . Anxiety   . Colon polyps   . Essential hypertension   . GERD (gastroesophageal reflux disease)   . History of gout   . Hyperlipidemia   . Paroxysmal atrial fibrillation (HCC)   . Prostate cancer ([place])   . Spinal stenosis   . Stroke [facility])    1994    Past Surgical History:  Procedure Laterality Date  . BILATERAL DECOMPRESSIVE LAMINECTOMIES & FORAMINOTOMIES L4-L5    . BILATERAL TOTAL HIP REPLACEMENTS    . CATARACT EXTRACTION W/PHACO Right 08/27/2017   Procedure: CATARACT EXTRACTION PHACO AND INTRAOCULAR LENS PLACEMENT (IOC);  Surgeon: Tonny Branch, MD;  Location: [facility] ORS;  Service: Ophthalmology;  Laterality: Right;  CDE: 10.52  . CATARACT EXTRACTION W/PHACO Left 10/29/2017   Procedure: CATARACT EXTRACTION PHACO AND INTRAOCULAR LENS PLACEMENT (IOC);  Surgeon: Tonny Branch, MD;  Location: [facility] OR[facility];  Service: Ophthalmology;  Laterality: Left;  CDE: 9.84  . COLONOSCOPY    . COLONOSCOPY N/A 09/15/2015   Procedure: COLONOSCOPY;  Surgeon: Rogene Houston, MD;  Location: [facility];  Service:  Endoscopy;  Laterality: N/A;  1030  . ENDOVENOUS ABLATION SAPHENOUS VEIN W/ LASER Left 01/08/2020   endovenous laser ablation left greater saphenous vein and stab phlebectomy 10-20 incisions left leg by Gae Gallop MD   . ENDOVENOUS ABLATION SAPHENOUS VEIN W/ LASER Right 03/04/2020   endovenous laser ablation right greater saphenous vein and stab phlebectomy> 20 incisions right leg by Gae Gallop MD   . [place]  2007   cancer-robotic    Current Outpatient Medications  Medication Sig Dispense Refill  . atorvastatin (LIPITOR) 20 MG tablet Take 20 mg by mouth at bedtime.     Marland Kitchen diltiazem (CARDIZEM CD) 240 MG 24 hr capsule Take 240 mg by mouth daily.      . DULoxetine (CYMBALTA) 30 MG capsule Take 30 mg by mouth See admin instructions. Take 30 mg by mouth 5 times weekly    . enalapril (VASOTEC) 20 MG tablet Take 20 mg by mouth daily.     . Glucosamine-MSM-Hyaluronic Acd (JOINT HEALTH PO) Take 1 tablet by mouth 2 (two) times daily.    . Multiple Vitamin (MULTIVITAMIN) tablet Take 1 tablet by mouth daily.    . Omega-3 Fatty Acids (FISH OIL) 1200 MG CAPS Take 1,200 mg by mouth 2 (two) times daily.     Marland Kitchen oxybutynin (DITROPAN XL) 15 MG 24 hr tablet Take 15 mg by mouth at bedtime.    . pregabalin (LYRICA) 150 MG capsule Take 150 mg by mouth 2 (two) times daily.    Marland Kitchen warfarin (COUMADIN)  2.5 MG tablet Take 2.5-5 mg by mouth See admin instructions. Take 2.5 mg by mouth daily in morning.    . zolpidem (AMBIEN) 10 MG tablet Take 10 mg by mouth at bedtime.      No current facility-administered medications for this visit.   Allergies:  Patient has no known allergies.   Social History: The patient  reports that he has never smoked. He has never used smokeless tobacco. He reports current alcohol use of about 14.0 standard drinks of alcohol per week. He reports that he does not use drugs.   Family History: The patient's family history includes Heart attack (age of onset: 70) in his brother;  Heart disease in his father and mother.   ROS:  Please see the history of present illness. Otherwise, complete review of systems is positive for {NONE DEFAULTED:18576::"none"}.  All other systems are reviewed and negative.   Physical Exam: VS:  There were no vitals taken for this visit., BMI There is no height or weight on file to calculate BMI.  Wt Readings from Last 3 Encounters:  03/11/20 189 lb (85.7 kg)  03/04/20 189 lb (85.7 kg)  01/19/20 192 lb (87.1 kg)    General: Patient appears comfortable at rest. HEENT: Conjunctiva and lids normal, oropharynx clear with moist mucosa. Neck: Supple, no elevated JVP or carotid bruits, no thyromegaly. Lungs: Clear to auscultation, nonlabored breathing at rest. Cardiac: Regular rate and rhythm, no S3 or significant systolic murmur, no pericardial rub. Abdomen: Soft, nontender, no hepatomegaly, bowel sounds present, no guarding or rebound. Extremities: No pitting edema, distal pulses 2+. Skin: Warm and dry. Musculoskeletal: No kyphosis. Neuropsychiatric: Alert and oriented x3, affect grossly appropriate.  ECG:  {EKG/Telemetry Strips Reviewed:[id number]}  Recent Labwork: No results found for requested labs within last 8760 hours.  No results found for: CHOL, TRIG, HDL, CHOLHDL, VLDL, LDLCALC, LDLDIRECT  Other Studies Reviewed Today:   Assessment and Plan:  1. Permanent atrial fibrillation ([place])   2. Essential hypertension, benign    1. Permanent atrial fibrillation (HCC) ***  2. Essential hypertension, benign ***  Medication Adjustments/Labs and Tests Ordered: Current medicines are reviewed at length with the patient today.  Concerns regarding medicines are outlined above.   Disposition: Follow-up with   Signed, Levell July, NP 07/28/2020 10:34 PM    [facility] [place], [place], [place] [address] Phone: [phone]; Fax: [phone]

## 2020-07-29 ENCOUNTER — Ambulatory Visit: Payer: Medicare Other | Admitting: Family Medicine

## 2020-07-29 DIAGNOSIS — I10 Essential (primary) hypertension: Secondary | ICD-10-CM

## 2020-07-29 DIAGNOSIS — I4821 Permanent atrial fibrillation: Secondary | ICD-10-CM

## 2020-08-02 DIAGNOSIS — M79672 Pain in left foot: Secondary | ICD-10-CM | POA: Diagnosis not present

## 2020-08-02 DIAGNOSIS — L110 Acquired keratosis follicularis: Secondary | ICD-10-CM | POA: Diagnosis not present

## 2020-08-02 DIAGNOSIS — L89892 Pressure ulcer of other site, stage 2: Secondary | ICD-10-CM | POA: Diagnosis not present

## 2020-08-02 DIAGNOSIS — E1140 Type 2 diabetes mellitus with diabetic neuropathy, unspecified: Secondary | ICD-10-CM | POA: Diagnosis not present

## 2020-08-02 DIAGNOSIS — M79671 Pain in right foot: Secondary | ICD-10-CM | POA: Diagnosis not present

## 2020-08-10 DIAGNOSIS — M79672 Pain in left foot: Secondary | ICD-10-CM | POA: Diagnosis not present

## 2020-08-10 DIAGNOSIS — L110 Acquired keratosis follicularis: Secondary | ICD-10-CM | POA: Diagnosis not present

## 2020-08-10 DIAGNOSIS — I739 Peripheral vascular disease, unspecified: Secondary | ICD-10-CM | POA: Diagnosis not present

## 2020-08-10 DIAGNOSIS — E1140 Type 2 diabetes mellitus with diabetic neuropathy, unspecified: Secondary | ICD-10-CM | POA: Diagnosis not present

## 2020-08-10 DIAGNOSIS — M79671 Pain in right foot: Secondary | ICD-10-CM | POA: Diagnosis not present

## 2020-08-12 DIAGNOSIS — I10 Essential (primary) hypertension: Secondary | ICD-10-CM | POA: Diagnosis not present

## 2020-08-12 DIAGNOSIS — I4891 Unspecified atrial fibrillation: Secondary | ICD-10-CM | POA: Diagnosis not present

## 2020-08-12 DIAGNOSIS — Z299 Encounter for prophylactic measures, unspecified: Secondary | ICD-10-CM | POA: Diagnosis not present

## 2020-09-10 DIAGNOSIS — I4891 Unspecified atrial fibrillation: Secondary | ICD-10-CM | POA: Diagnosis not present

## 2020-09-10 DIAGNOSIS — I10 Essential (primary) hypertension: Secondary | ICD-10-CM | POA: Diagnosis not present

## 2020-09-10 DIAGNOSIS — Z299 Encounter for prophylactic measures, unspecified: Secondary | ICD-10-CM | POA: Diagnosis not present

## 2020-09-13 ENCOUNTER — Encounter (INDEPENDENT_AMBULATORY_CARE_PROVIDER_SITE_OTHER): Payer: Self-pay | Admitting: *Deleted

## 2020-09-23 DIAGNOSIS — E1140 Type 2 diabetes mellitus with diabetic neuropathy, unspecified: Secondary | ICD-10-CM | POA: Diagnosis not present

## 2020-09-23 DIAGNOSIS — L89892 Pressure ulcer of other site, stage 2: Secondary | ICD-10-CM | POA: Diagnosis not present

## 2020-09-23 DIAGNOSIS — M79674 Pain in right toe(s): Secondary | ICD-10-CM | POA: Diagnosis not present

## 2020-09-23 DIAGNOSIS — M79672 Pain in left foot: Secondary | ICD-10-CM | POA: Diagnosis not present

## 2020-09-23 DIAGNOSIS — M79671 Pain in right foot: Secondary | ICD-10-CM | POA: Diagnosis not present

## 2020-10-05 DIAGNOSIS — L89892 Pressure ulcer of other site, stage 2: Secondary | ICD-10-CM | POA: Diagnosis not present

## 2020-10-05 DIAGNOSIS — E1140 Type 2 diabetes mellitus with diabetic neuropathy, unspecified: Secondary | ICD-10-CM | POA: Diagnosis not present

## 2020-10-05 DIAGNOSIS — M79671 Pain in right foot: Secondary | ICD-10-CM | POA: Diagnosis not present

## 2020-10-05 DIAGNOSIS — M79674 Pain in right toe(s): Secondary | ICD-10-CM | POA: Diagnosis not present

## 2020-10-13 DIAGNOSIS — I10 Essential (primary) hypertension: Secondary | ICD-10-CM | POA: Diagnosis not present

## 2020-10-13 DIAGNOSIS — Z299 Encounter for prophylactic measures, unspecified: Secondary | ICD-10-CM | POA: Diagnosis not present

## 2020-10-13 DIAGNOSIS — I4891 Unspecified atrial fibrillation: Secondary | ICD-10-CM | POA: Diagnosis not present

## 2020-10-25 DIAGNOSIS — M79675 Pain in left toe(s): Secondary | ICD-10-CM | POA: Diagnosis not present

## 2020-10-25 DIAGNOSIS — M79671 Pain in right foot: Secondary | ICD-10-CM | POA: Diagnosis not present

## 2020-10-25 DIAGNOSIS — I739 Peripheral vascular disease, unspecified: Secondary | ICD-10-CM | POA: Diagnosis not present

## 2020-10-25 DIAGNOSIS — E1140 Type 2 diabetes mellitus with diabetic neuropathy, unspecified: Secondary | ICD-10-CM | POA: Diagnosis not present

## 2020-10-25 DIAGNOSIS — L110 Acquired keratosis follicularis: Secondary | ICD-10-CM | POA: Diagnosis not present

## 2020-10-25 DIAGNOSIS — M79672 Pain in left foot: Secondary | ICD-10-CM | POA: Diagnosis not present

## 2020-10-25 DIAGNOSIS — M79674 Pain in right toe(s): Secondary | ICD-10-CM | POA: Diagnosis not present

## 2020-11-10 DIAGNOSIS — I4891 Unspecified atrial fibrillation: Secondary | ICD-10-CM | POA: Diagnosis not present

## 2020-11-10 DIAGNOSIS — Z299 Encounter for prophylactic measures, unspecified: Secondary | ICD-10-CM | POA: Diagnosis not present

## 2020-11-10 DIAGNOSIS — I10 Essential (primary) hypertension: Secondary | ICD-10-CM | POA: Diagnosis not present

## 2020-12-10 DIAGNOSIS — I739 Peripheral vascular disease, unspecified: Secondary | ICD-10-CM | POA: Diagnosis not present

## 2020-12-10 DIAGNOSIS — I4891 Unspecified atrial fibrillation: Secondary | ICD-10-CM | POA: Diagnosis not present

## 2020-12-10 DIAGNOSIS — Z299 Encounter for prophylactic measures, unspecified: Secondary | ICD-10-CM | POA: Diagnosis not present

## 2020-12-10 DIAGNOSIS — I10 Essential (primary) hypertension: Secondary | ICD-10-CM | POA: Diagnosis not present

## 2020-12-10 DIAGNOSIS — D692 Other nonthrombocytopenic purpura: Secondary | ICD-10-CM | POA: Diagnosis not present

## 2020-12-10 DIAGNOSIS — D6869 Other thrombophilia: Secondary | ICD-10-CM | POA: Diagnosis not present

## 2021-01-03 DIAGNOSIS — I739 Peripheral vascular disease, unspecified: Secondary | ICD-10-CM | POA: Diagnosis not present

## 2021-01-03 DIAGNOSIS — M79675 Pain in left toe(s): Secondary | ICD-10-CM | POA: Diagnosis not present

## 2021-01-03 DIAGNOSIS — E1140 Type 2 diabetes mellitus with diabetic neuropathy, unspecified: Secondary | ICD-10-CM | POA: Diagnosis not present

## 2021-01-03 DIAGNOSIS — M79672 Pain in left foot: Secondary | ICD-10-CM | POA: Diagnosis not present

## 2021-01-03 DIAGNOSIS — M79674 Pain in right toe(s): Secondary | ICD-10-CM | POA: Diagnosis not present

## 2021-01-03 DIAGNOSIS — L110 Acquired keratosis follicularis: Secondary | ICD-10-CM | POA: Diagnosis not present

## 2021-01-03 DIAGNOSIS — M79671 Pain in right foot: Secondary | ICD-10-CM | POA: Diagnosis not present

## 2021-01-07 DIAGNOSIS — I4891 Unspecified atrial fibrillation: Secondary | ICD-10-CM | POA: Diagnosis not present

## 2021-01-07 DIAGNOSIS — Z789 Other specified health status: Secondary | ICD-10-CM | POA: Diagnosis not present

## 2021-01-07 DIAGNOSIS — Z299 Encounter for prophylactic measures, unspecified: Secondary | ICD-10-CM | POA: Diagnosis not present

## 2021-01-07 DIAGNOSIS — I10 Essential (primary) hypertension: Secondary | ICD-10-CM | POA: Diagnosis not present

## 2021-02-03 DIAGNOSIS — Z7189 Other specified counseling: Secondary | ICD-10-CM | POA: Diagnosis not present

## 2021-02-03 DIAGNOSIS — Z299 Encounter for prophylactic measures, unspecified: Secondary | ICD-10-CM | POA: Diagnosis not present

## 2021-02-03 DIAGNOSIS — I4891 Unspecified atrial fibrillation: Secondary | ICD-10-CM | POA: Diagnosis not present

## 2021-02-03 DIAGNOSIS — Z79899 Other long term (current) drug therapy: Secondary | ICD-10-CM | POA: Diagnosis not present

## 2021-02-03 DIAGNOSIS — R5383 Other fatigue: Secondary | ICD-10-CM | POA: Diagnosis not present

## 2021-02-03 DIAGNOSIS — Z0000 Encounter for general adult medical examination without abnormal findings: Secondary | ICD-10-CM | POA: Diagnosis not present

## 2021-02-03 DIAGNOSIS — I10 Essential (primary) hypertension: Secondary | ICD-10-CM | POA: Diagnosis not present

## 2021-02-03 DIAGNOSIS — E7800 Pure hypercholesterolemia, unspecified: Secondary | ICD-10-CM | POA: Diagnosis not present

## 2021-02-08 DIAGNOSIS — I4891 Unspecified atrial fibrillation: Secondary | ICD-10-CM | POA: Diagnosis not present

## 2021-02-08 DIAGNOSIS — Z299 Encounter for prophylactic measures, unspecified: Secondary | ICD-10-CM | POA: Diagnosis not present

## 2021-02-08 DIAGNOSIS — I739 Peripheral vascular disease, unspecified: Secondary | ICD-10-CM | POA: Diagnosis not present

## 2021-02-08 DIAGNOSIS — I10 Essential (primary) hypertension: Secondary | ICD-10-CM | POA: Diagnosis not present

## 2021-03-07 DIAGNOSIS — I4891 Unspecified atrial fibrillation: Secondary | ICD-10-CM | POA: Diagnosis not present

## 2021-03-07 DIAGNOSIS — M79674 Pain in right toe(s): Secondary | ICD-10-CM | POA: Diagnosis not present

## 2021-03-07 DIAGNOSIS — R5383 Other fatigue: Secondary | ICD-10-CM | POA: Diagnosis not present

## 2021-03-07 DIAGNOSIS — M79675 Pain in left toe(s): Secondary | ICD-10-CM | POA: Diagnosis not present

## 2021-03-07 DIAGNOSIS — E7800 Pure hypercholesterolemia, unspecified: Secondary | ICD-10-CM | POA: Diagnosis not present

## 2021-03-07 DIAGNOSIS — M79671 Pain in right foot: Secondary | ICD-10-CM | POA: Diagnosis not present

## 2021-03-07 DIAGNOSIS — L110 Acquired keratosis follicularis: Secondary | ICD-10-CM | POA: Diagnosis not present

## 2021-03-07 DIAGNOSIS — Z79899 Other long term (current) drug therapy: Secondary | ICD-10-CM | POA: Diagnosis not present

## 2021-03-07 DIAGNOSIS — M79672 Pain in left foot: Secondary | ICD-10-CM | POA: Diagnosis not present

## 2021-03-07 DIAGNOSIS — I10 Essential (primary) hypertension: Secondary | ICD-10-CM | POA: Diagnosis not present

## 2021-03-07 DIAGNOSIS — E1140 Type 2 diabetes mellitus with diabetic neuropathy, unspecified: Secondary | ICD-10-CM | POA: Diagnosis not present

## 2021-03-07 DIAGNOSIS — Z299 Encounter for prophylactic measures, unspecified: Secondary | ICD-10-CM | POA: Diagnosis not present

## 2021-03-07 DIAGNOSIS — I739 Peripheral vascular disease, unspecified: Secondary | ICD-10-CM | POA: Diagnosis not present

## 2021-04-04 DIAGNOSIS — I10 Essential (primary) hypertension: Secondary | ICD-10-CM | POA: Diagnosis not present

## 2021-04-04 DIAGNOSIS — Z299 Encounter for prophylactic measures, unspecified: Secondary | ICD-10-CM | POA: Diagnosis not present

## 2021-04-04 DIAGNOSIS — I4891 Unspecified atrial fibrillation: Secondary | ICD-10-CM | POA: Diagnosis not present

## 2021-05-04 DIAGNOSIS — J069 Acute upper respiratory infection, unspecified: Secondary | ICD-10-CM | POA: Diagnosis not present

## 2021-05-04 DIAGNOSIS — Z299 Encounter for prophylactic measures, unspecified: Secondary | ICD-10-CM | POA: Diagnosis not present

## 2021-05-04 DIAGNOSIS — I10 Essential (primary) hypertension: Secondary | ICD-10-CM | POA: Diagnosis not present

## 2021-05-04 DIAGNOSIS — I4891 Unspecified atrial fibrillation: Secondary | ICD-10-CM | POA: Diagnosis not present

## 2021-05-16 DIAGNOSIS — I739 Peripheral vascular disease, unspecified: Secondary | ICD-10-CM | POA: Diagnosis not present

## 2021-05-16 DIAGNOSIS — M79672 Pain in left foot: Secondary | ICD-10-CM | POA: Diagnosis not present

## 2021-05-16 DIAGNOSIS — M79674 Pain in right toe(s): Secondary | ICD-10-CM | POA: Diagnosis not present

## 2021-05-16 DIAGNOSIS — M79675 Pain in left toe(s): Secondary | ICD-10-CM | POA: Diagnosis not present

## 2021-05-16 DIAGNOSIS — M79671 Pain in right foot: Secondary | ICD-10-CM | POA: Diagnosis not present

## 2021-05-16 DIAGNOSIS — E1140 Type 2 diabetes mellitus with diabetic neuropathy, unspecified: Secondary | ICD-10-CM | POA: Diagnosis not present

## 2021-05-16 DIAGNOSIS — L110 Acquired keratosis follicularis: Secondary | ICD-10-CM | POA: Diagnosis not present

## 2021-06-06 DIAGNOSIS — I4891 Unspecified atrial fibrillation: Secondary | ICD-10-CM | POA: Diagnosis not present

## 2021-06-06 DIAGNOSIS — E7800 Pure hypercholesterolemia, unspecified: Secondary | ICD-10-CM | POA: Diagnosis not present

## 2021-06-06 DIAGNOSIS — N3281 Overactive bladder: Secondary | ICD-10-CM | POA: Diagnosis not present

## 2021-06-06 DIAGNOSIS — Z299 Encounter for prophylactic measures, unspecified: Secondary | ICD-10-CM | POA: Diagnosis not present

## 2021-06-06 DIAGNOSIS — I10 Essential (primary) hypertension: Secondary | ICD-10-CM | POA: Diagnosis not present

## 2021-07-04 DIAGNOSIS — K219 Gastro-esophageal reflux disease without esophagitis: Secondary | ICD-10-CM | POA: Diagnosis not present

## 2021-07-04 DIAGNOSIS — D692 Other nonthrombocytopenic purpura: Secondary | ICD-10-CM | POA: Diagnosis not present

## 2021-07-04 DIAGNOSIS — I4891 Unspecified atrial fibrillation: Secondary | ICD-10-CM | POA: Diagnosis not present

## 2021-07-04 DIAGNOSIS — Z299 Encounter for prophylactic measures, unspecified: Secondary | ICD-10-CM | POA: Diagnosis not present

## 2021-07-04 DIAGNOSIS — I10 Essential (primary) hypertension: Secondary | ICD-10-CM | POA: Diagnosis not present

## 2021-07-13 ENCOUNTER — Encounter: Payer: Self-pay | Admitting: Urology

## 2021-07-13 ENCOUNTER — Ambulatory Visit: Payer: Medicare Other | Admitting: Urology

## 2021-07-13 ENCOUNTER — Other Ambulatory Visit: Payer: Self-pay

## 2021-07-13 VITALS — BP 156/87 | HR 83

## 2021-07-13 DIAGNOSIS — N5231 Erectile dysfunction following radical prostatectomy: Secondary | ICD-10-CM | POA: Diagnosis not present

## 2021-07-13 DIAGNOSIS — Z8546 Personal history of malignant neoplasm of prostate: Secondary | ICD-10-CM | POA: Diagnosis not present

## 2021-07-13 DIAGNOSIS — R350 Frequency of micturition: Secondary | ICD-10-CM

## 2021-07-13 DIAGNOSIS — R32 Unspecified urinary incontinence: Secondary | ICD-10-CM | POA: Diagnosis not present

## 2021-07-13 DIAGNOSIS — N529 Male erectile dysfunction, unspecified: Secondary | ICD-10-CM

## 2021-07-13 LAB — URINALYSIS, ROUTINE W REFLEX MICROSCOPIC
Bilirubin, UA: NEGATIVE
Glucose, UA: NEGATIVE
Ketones, UA: NEGATIVE
Leukocytes,UA: NEGATIVE
Nitrite, UA: NEGATIVE
Protein,UA: NEGATIVE
RBC, UA: NEGATIVE
Specific Gravity, UA: 1.015 (ref 1.005–1.030)
Urobilinogen, Ur: 0.2 mg/dL (ref 0.2–1.0)
pH, UA: 6.5 (ref 5.0–7.5)

## 2021-07-13 LAB — BLADDER SCAN AMB NON-IMAGING: Scan Result: 0

## 2021-07-13 NOTE — Progress Notes (Signed)
Assessment: 1. Erectile dysfunction after radical prostatectomy   2. Urinary frequency   3. History of prostate cancer   4. Urinary incontinence, unspecified type      Plan: Prescription for new vacuum erection device provided. Continue to hold Myrbetriq at this time to see if his issue with decreased sensation with climax improves. Request recent PSA results from Dr. Woody Seller Return to office in 6 weeks.  Chief Complaint:  Chief Complaint  Patient presents with   Erectile Dysfunction     History of Present Illness:  Nicholas Berry is a 80 y.o. year old male who is seen in consultation from Glenda Chroman, MD  for evaluation of erectile dysfunction.  He is status post a radical prostatectomy in 2007 for management of Gleason 7 prostate cancer.  Pathology showed pT2cN0Mx adenocarcinoma.  By report, his PSAs have been undetectable.  No results available for review. He does have a history of urinary incontinence and is wearing pads for management.  He reports voiding with a good stream.  No dysuria or gross hematuria.  He has been previously managed with oxybutynin.  He was changed to Myrbetriq approximately 2 months ago.  He does not feel like the Myrbetriq was improving his symptoms.  He continues to have urinary frequency, urgency, and nocturia. AUA score = 7 today. He has a history of erectile dysfunction since his prostatectomy.  He has previously failed medical therapy for ED.  He has been using a VED for the past 15 years with satisfactory results.  He reports a recent problem with decreased sensation of climax.  He is able to achieve an adequate erection with the vacuum erection device.  He reports decreased sensation of climax after starting Myrbetriq.  He discontinued the Myrbetriq about 2 weeks ago.   Past Medical History:  Past Medical History:  Diagnosis Date   Anxiety    Colon polyps    Essential hypertension    GERD (gastroesophageal reflux disease)    History of gout     Hyperlipidemia    Paroxysmal atrial fibrillation (HCC)    Prostate cancer ([place])    Spinal stenosis    Stroke ([place])    1994    Past Surgical History:  Past Surgical History:  Procedure Laterality Date   BILATERAL DECOMPRESSIVE LAMINECTOMIES & FORAMINOTOMIES L4-L5     BILATERAL TOTAL HIP REPLACEMENTS     CATARACT EXTRACTION W/PHACO Right 08/27/2017   Procedure: CATARACT EXTRACTION PHACO AND INTRAOCULAR LENS PLACEMENT ([place]);  Surgeon: Tonny Branch, MD;  Location: [facility];  Service: Ophthalmology;  Laterality: Right;  CDE: 10.52   CATARACT EXTRACTION W/PHACO Left 10/29/2017   Procedure: CATARACT EXTRACTION PHACO AND INTRAOCULAR LENS PLACEMENT (IOC);  Surgeon: Tonny Branch, MD;  Location: [facility];  Service: Ophthalmology;  Laterality: Left;  CDE: 9.84   COLONOSCOPY     COLONOSCOPY N/A 09/15/2015   Procedure: COLONOSCOPY;  Surgeon: Rogene Houston, MD;  Location[facility];  Service: Endoscopy;  Laterality: N/A;  1030   ENDOVENOUS ABLATION SAPHENOUS VEIN W/ LASER Left 01/08/2020   endovenous laser ablation left greater saphenous vein and stab phlebectomy 10-20 incisions left leg by Gae Gallop MD    ENDOVENOUS [place] W/ LASER Right 03/04/2020   endovenous laser ablation right greater saphenous vein and stab phlebectomy> 20 incisions right leg by Gae Gallop MD    PROSTATE SURGERY  2007   cancer-robotic    Allergies:  No Known Allergies  Family History:  Family History  Problem  Relation Age of Onset   Heart attack Brother 69       CABG   Heart disease Mother    Heart disease Father     Social History:  Social History   Tobacco Use   Smoking status: Never   Smokeless tobacco: Never  Vaping Use   Vaping Use: Never used  Substance Use Topics   Alcohol use: Yes    Alcohol/week: 14.0 standard drinks    Types: 14 Cans of beer per week   Drug use: No    Review of symptoms:  Constitutional:  Negative for unexplained weight loss, night sweats, fever,  chills ENT:  Negative for nose bleeds, sinus pain, painful swallowing CV:  Negative for chest pain, shortness of breath, exercise intolerance, palpitations, loss of consciousness Resp:  Negative for cough, wheezing, shortness of breath GI:  Negative for nausea, vomiting, diarrhea, bloody stools GU:  Positives noted in HPI; otherwise negative for gross hematuria, dysuria, urinary incontinence Neuro:  Negative for seizures, poor balance, limb weakness, slurred speech Psych:  Negative for lack of energy, depression, anxiety Endocrine:  Negative for polydipsia, polyuria, symptoms of hypoglycemia (dizziness, hunger, sweating) Hematologic:  Negative for anemia, purpura, petechia, prolonged or excessive bleeding, use of anticoagulants  Allergic:  Negative for difficulty breathing or choking as a result of exposure to anything; no shellfish allergy; no allergic response (rash/itch) to materials, foods  Physical exam: BP (!) 156/87   Pulse 83  GENERAL APPEARANCE:  Well appearing, well developed, well nourished, NAD HEENT: Atraumatic, Normocephalic, oropharynx clear. NECK: Supple without lymphadenopathy or thyromegaly. LUNGS: Clear to auscultation bilaterally. HEART: Regular Rate and Rhythm without murmurs, gallops, or rubs. ABDOMEN: Soft, non-tender, No Masses. EXTREMITIES: Moves all extremities well.  Without clubbing, cyanosis, or edema. NEUROLOGIC:  Alert and oriented x 3, normal gait, CN II-XII grossly intact.  MENTAL STATUS:  Appropriate. BACK:  Non-tender to palpation.  No CVAT SKIN:  Warm, dry and intact.   GU: Penis:  uncircumcised Meatus: Normal Scrotum: normal, no masses Testis: normal without masses bilateral   Results: Results for orders placed or performed in visit on 07/13/21 (from the past 24 hour(s))  Urinalysis, Routine w reflex microscopic   Collection Time: 07/13/21 10:12 AM  Result Value Ref Range   Specific Gravity, UA 1.015 1.005 - 1.030   pH, UA 6.5 5.0 - 7.5    Color, UA Yellow Yellow   Appearance Ur Clear Clear   Leukocytes,UA Negative Negative   Protein,UA Negative Negative/Trace   Glucose, UA Negative Negative   Ketones, UA Negative Negative   RBC, UA Negative Negative   Bilirubin, UA Negative Negative   Urobilinogen, Ur 0.2 0.2 - 1.0 mg/dL   Nitrite, UA Negative Negative   Microscopic Examination Comment   BLADDER SCAN AMB NON-IMAGING   Collection Time: 07/13/21 10:17 AM  Result Value Ref Range   Scan Result 0

## 2021-07-13 NOTE — Progress Notes (Signed)
post void residual=0   Urological Symptom Review  Patient is experiencing the following symptoms: Frequent urination Hard to postpone urination Get up at night to urinate Leakage of urine Stream starts and stops   Review of Systems  Gastrointestinal (upper)  : Negative for upper GI symptoms  Gastrointestinal (lower) : Negative for lower GI symptoms  Constitutional : Negative for symptoms  Skin: Negative for skin symptoms  Eyes: Negative for eye symptoms  Ear/Nose/Throat : Negative for Ear/Nose/Throat symptoms  Hematologic/Lymphatic: Negative for Hematologic/Lymphatic symptoms  Cardiovascular : Negative for cardiovascular symptoms  Respiratory : Negative for respiratory symptoms  Endocrine: Negative for endocrine symptoms  Musculoskeletal: Joint pain  Neurological: Dizziness  Psychologic: Negative for psychiatric symptoms

## 2021-07-25 DIAGNOSIS — M79671 Pain in right foot: Secondary | ICD-10-CM | POA: Diagnosis not present

## 2021-07-25 DIAGNOSIS — I739 Peripheral vascular disease, unspecified: Secondary | ICD-10-CM | POA: Diagnosis not present

## 2021-07-25 DIAGNOSIS — E1140 Type 2 diabetes mellitus with diabetic neuropathy, unspecified: Secondary | ICD-10-CM | POA: Diagnosis not present

## 2021-07-25 DIAGNOSIS — L110 Acquired keratosis follicularis: Secondary | ICD-10-CM | POA: Diagnosis not present

## 2021-07-25 DIAGNOSIS — M79675 Pain in left toe(s): Secondary | ICD-10-CM | POA: Diagnosis not present

## 2021-07-25 DIAGNOSIS — M79672 Pain in left foot: Secondary | ICD-10-CM | POA: Diagnosis not present

## 2021-07-25 DIAGNOSIS — M79674 Pain in right toe(s): Secondary | ICD-10-CM | POA: Diagnosis not present

## 2021-08-01 DIAGNOSIS — I4891 Unspecified atrial fibrillation: Secondary | ICD-10-CM | POA: Diagnosis not present

## 2021-08-01 DIAGNOSIS — Z299 Encounter for prophylactic measures, unspecified: Secondary | ICD-10-CM | POA: Diagnosis not present

## 2021-08-01 DIAGNOSIS — I10 Essential (primary) hypertension: Secondary | ICD-10-CM | POA: Diagnosis not present

## 2021-08-01 DIAGNOSIS — M25551 Pain in right hip: Secondary | ICD-10-CM | POA: Diagnosis not present

## 2021-08-24 ENCOUNTER — Encounter: Payer: Self-pay | Admitting: Urology

## 2021-08-24 ENCOUNTER — Other Ambulatory Visit: Payer: Self-pay

## 2021-08-24 ENCOUNTER — Ambulatory Visit: Payer: Medicare Other | Admitting: Urology

## 2021-08-24 ENCOUNTER — Telehealth: Payer: Self-pay

## 2021-08-24 VITALS — BP 164/94 | HR 86 | Temp 97.7°F

## 2021-08-24 DIAGNOSIS — R350 Frequency of micturition: Secondary | ICD-10-CM | POA: Diagnosis not present

## 2021-08-24 DIAGNOSIS — Z8546 Personal history of malignant neoplasm of prostate: Secondary | ICD-10-CM

## 2021-08-24 DIAGNOSIS — R32 Unspecified urinary incontinence: Secondary | ICD-10-CM

## 2021-08-24 DIAGNOSIS — N5231 Erectile dysfunction following radical prostatectomy: Secondary | ICD-10-CM

## 2021-08-24 LAB — URINALYSIS, ROUTINE W REFLEX MICROSCOPIC
Bilirubin, UA: NEGATIVE
Glucose, UA: NEGATIVE
Ketones, UA: NEGATIVE
Leukocytes,UA: NEGATIVE
Nitrite, UA: NEGATIVE
RBC, UA: NEGATIVE
Specific Gravity, UA: 1.02 (ref 1.005–1.030)
Urobilinogen, Ur: 1 mg/dL (ref 0.2–1.0)
pH, UA: 6 (ref 5.0–7.5)

## 2021-08-24 NOTE — Progress Notes (Signed)
Assessment: 1. History of prostate cancer; Nicholas/p radical prostatectomy 2007; PSA undetectable   2. Urinary frequency   3. Urinary incontinence, unspecified type   4. Erectile dysfunction after radical prostatectomy     Plan: Prescription for new vacuum erection device provided. Will hold off on any medical therapy for his incontinence at this time Return to office in 6 months.  Chief Complaint: Chief Complaint  Patient presents with   Erectile Dysfunction   Urinary Incontinence     HPI: Nicholas Berry is a 80 y.o. male who presents for continued evaluation of erectile dysfunction, history of prostate cancer, and urinary incontinence.    He is status post a radical prostatectomy in 2007 for management of Gleason 7 prostate cancer.  Pathology showed pT2cN0Mx adenocarcinoma.  By report, his PSAs have been undetectable.  PSA from 4/21:  <0.1.  He does have a history of urinary incontinence and wears pads for management.  He reports voiding with a good stream.  No dysuria or gross hematuria.  He had been previously managed with oxybutynin.  He was changed to Myrbetriq approximately 2 months ago.  He did not feel like the Myrbetriq was improving his symptoms so discontinued the medication.  He continued to have urinary frequency, urgency, and nocturia. AUA score = 7.  He has a history of erectile dysfunction since his prostatectomy.  He has previously failed medical therapy for ED.  He has been using a VED for the past 15 years with satisfactory results.  He reported a recent problem with decreased sensation of climax.  He is able to achieve an adequate erection with the vacuum erection device.  He reported decreased sensation of climax after starting Myrbetriq.    He returns today for follow-up.  His urinary incontinence has not worsened while off of the Myrbetriq for approximately 1 month.  No dysuria or gross hematuria. AUA score = 2 today. He has not seen any change in his decree  sensation of climax.  He continues to use a vacuum erection device with satisfactory results.  He is interested in obtaining a new device.  Portions of the above documentation were copied from a prior visit for review purposes only.  Allergies: No Known Allergies  PMH: Past Medical History:  Diagnosis Date   Anxiety    Colon polyps    Essential hypertension    GERD (gastroesophageal reflux disease)    History of gout    Hyperlipidemia    Paroxysmal atrial fibrillation (HCC)    Prostate cancer ([place])    Spinal stenosis    Stroke ([place])    1994    PSH: Past Surgical History:  Procedure Laterality Date   BILATERAL DECOMPRESSIVE LAMINECTOMIES & FORAMINOTOMIES L4-L5     BILATERAL TOTAL HIP REPLACEMENTS     CATARACT EXTRACTION W/PHACO Right 08/27/2017   Procedure: CATARACT EXTRACTION PHACO AND INTRAOCULAR LENS PLACEMENT ([place]);  Surgeon: Tonny Branch, MD;  Location: [facility];  Service: Ophthalmology;  Laterality: Right;  CDE: 10.52   CATARACT EXTRACTION W/PHACO Left 10/29/2017   Procedure: CATARACT EXTRACTION PHACO AND INTRAOCULAR LENS PLACEMENT (IOC);  Surgeon: Tonny Branch, MD;  Location: [facility];  Service: Ophthalmology;  Laterality: Left;  CDE: 9.84   COLONOSCOPY     COLONOSCOPY N/A 09/15/2015   Procedure: COLONOSCOPY;  Surgeon: Rogene Houston, MD;  Location[facility];  Service: Endoscopy;  Laterality: N/A;  1030   ENDOVENOUS ABLATION SAPHENOUS VEIN W/ LASER Left 01/08/2020   endovenous laser ablation left greater saphenous  vein and stab phlebectomy 10-20 incisions left leg by Gae Gallop MD    ENDOVENOUS ABLATION SAPHENOUS VEIN W/ LASER Right 03/04/2020   endovenous laser ablation right greater saphenous vein and stab phlebectomy> 20 incisions right leg by Gae Gallop MD    PROSTATE SURGERY  2007   cancer-robotic    SH: Social History   Tobacco Use   Smoking status: Never   Smokeless tobacco: Never  Vaping Use   Vaping Use: Never used  Substance Use Topics    Alcohol use: Yes    Alcohol/week: 14.0 standard drinks    Types: 14 Cans of beer per week   Drug use: No    ROS: Constitutional:  Negative for fever, chills, weight loss CV: Negative for chest pain, previous MI, hypertension Respiratory:  Negative for shortness of breath, wheezing, sleep apnea, frequent cough GI:  Negative for nausea, vomiting, bloody stool, GERD  PE: BP (!) 164/94   Pulse 86   Temp 97.7 F (36.5 C)  GENERAL APPEARANCE:  Well appearing, well developed, well nourished, NAD HEENT:  Atraumatic, normocephalic, oropharynx clear NECK:  Supple without lymphadenopathy or thyromegaly ABDOMEN:  Soft, non-tender, no masses EXTREMITIES:  Moves all extremities well, without clubbing, cyanosis, or edema NEUROLOGIC:  Alert and oriented x 3, normal gait, CN II-XII grossly intact MENTAL STATUS:  appropriate BACK:  Non-tender to palpation, No CVAT SKIN:  Warm, dry, and intact   Results: U/A dipstick negative

## 2021-08-24 NOTE — Progress Notes (Signed)

## 2021-08-24 NOTE — Telephone Encounter (Signed)
[employer] form faxed to company.

## 2021-08-29 DIAGNOSIS — M79671 Pain in right foot: Secondary | ICD-10-CM | POA: Diagnosis not present

## 2021-08-29 DIAGNOSIS — M79675 Pain in left toe(s): Secondary | ICD-10-CM | POA: Diagnosis not present

## 2021-08-29 DIAGNOSIS — L110 Acquired keratosis follicularis: Secondary | ICD-10-CM | POA: Diagnosis not present

## 2021-08-29 DIAGNOSIS — I4891 Unspecified atrial fibrillation: Secondary | ICD-10-CM | POA: Diagnosis not present

## 2021-08-29 DIAGNOSIS — G629 Polyneuropathy, unspecified: Secondary | ICD-10-CM | POA: Diagnosis not present

## 2021-08-29 DIAGNOSIS — I739 Peripheral vascular disease, unspecified: Secondary | ICD-10-CM | POA: Diagnosis not present

## 2021-08-29 DIAGNOSIS — Z299 Encounter for prophylactic measures, unspecified: Secondary | ICD-10-CM | POA: Diagnosis not present

## 2021-08-29 DIAGNOSIS — I10 Essential (primary) hypertension: Secondary | ICD-10-CM | POA: Diagnosis not present

## 2021-08-29 DIAGNOSIS — M79674 Pain in right toe(s): Secondary | ICD-10-CM | POA: Diagnosis not present

## 2021-08-29 DIAGNOSIS — M79672 Pain in left foot: Secondary | ICD-10-CM | POA: Diagnosis not present

## 2021-08-29 DIAGNOSIS — E1140 Type 2 diabetes mellitus with diabetic neuropathy, unspecified: Secondary | ICD-10-CM | POA: Diagnosis not present

## 2021-09-29 DIAGNOSIS — D6869 Other thrombophilia: Secondary | ICD-10-CM | POA: Diagnosis not present

## 2021-09-29 DIAGNOSIS — I10 Essential (primary) hypertension: Secondary | ICD-10-CM | POA: Diagnosis not present

## 2021-09-29 DIAGNOSIS — Z299 Encounter for prophylactic measures, unspecified: Secondary | ICD-10-CM | POA: Diagnosis not present

## 2021-09-29 DIAGNOSIS — I4891 Unspecified atrial fibrillation: Secondary | ICD-10-CM | POA: Diagnosis not present

## 2021-10-17 IMAGING — US US EXTREM LOW VENOUS*L*
1 series · 13 of 24 positions shown · non-contrast
Comparison: None.

CLINICAL DATA: Left lower extremity pain and edema for the past
week. History of prostate cancer. Patient is currently on coumadin.
Evaluate for DVT.



[Series 1: us extrem low venous*left* · 13 of 40 slices shown]
[im 1/40]
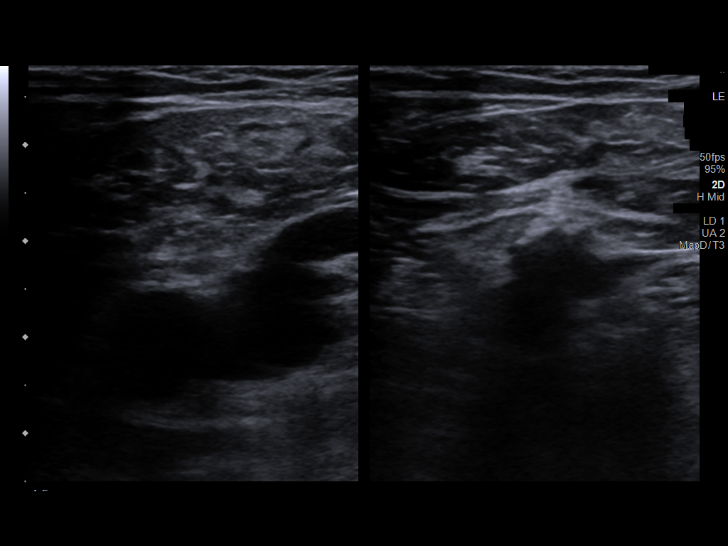
[im 4/40]
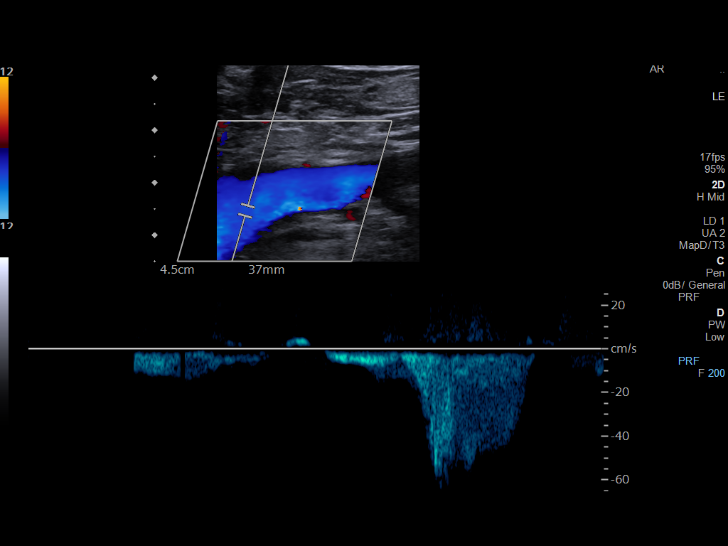
[im 7/40]
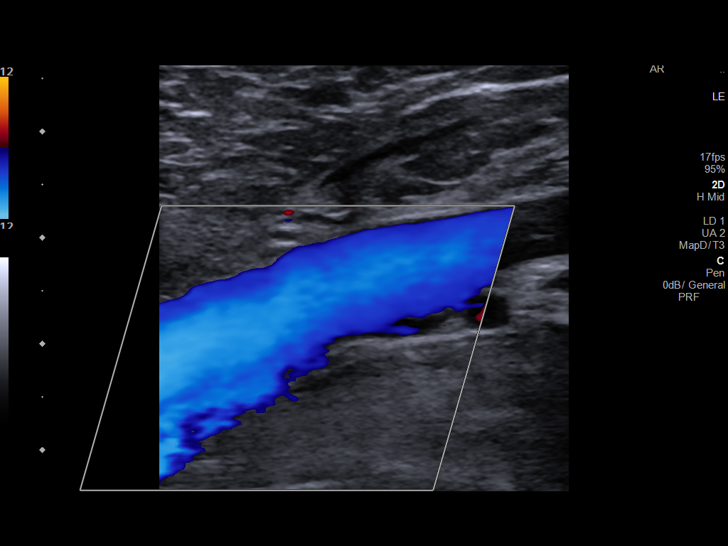
[im 11/40]
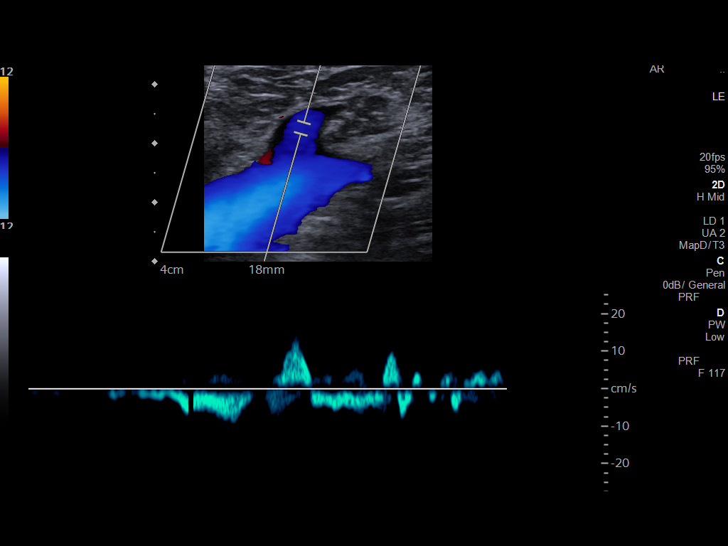
[im 14/40]
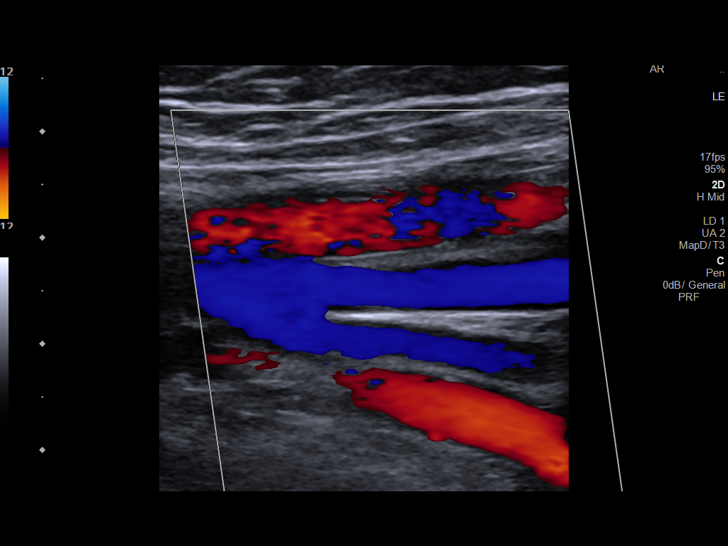
[im 17/40]
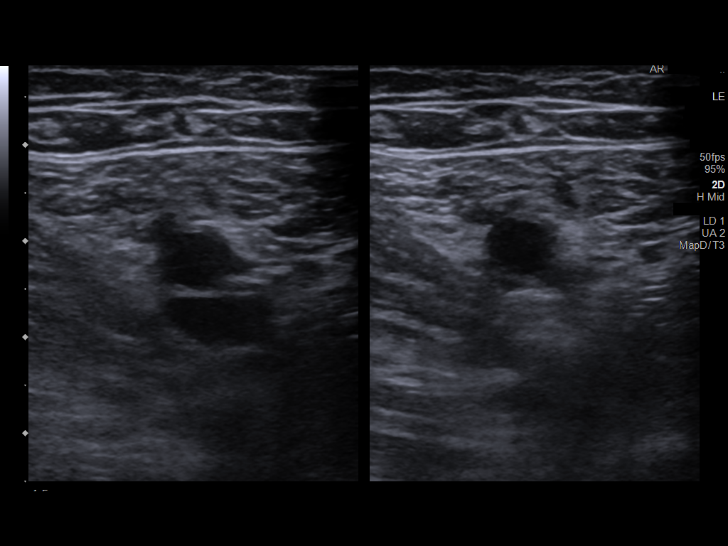
[im 21/40]
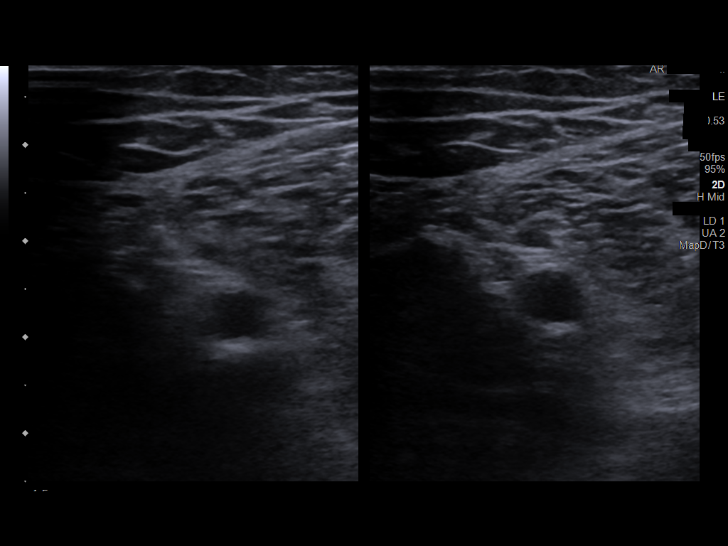
[im 23/40]
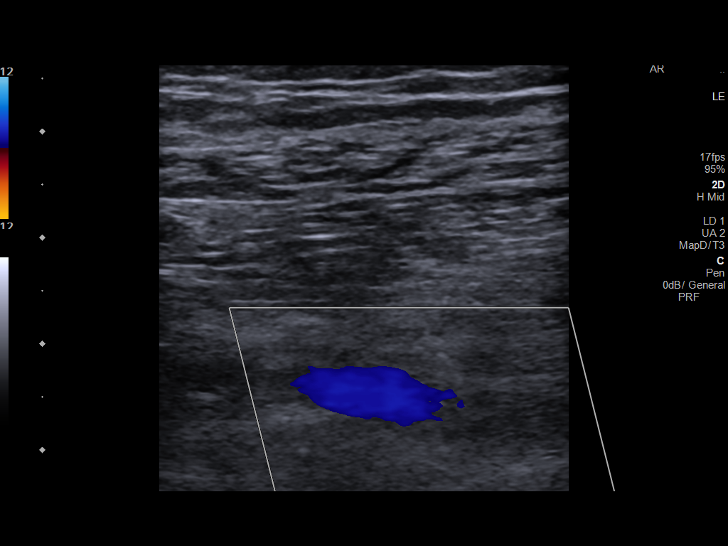
[im 26/40]
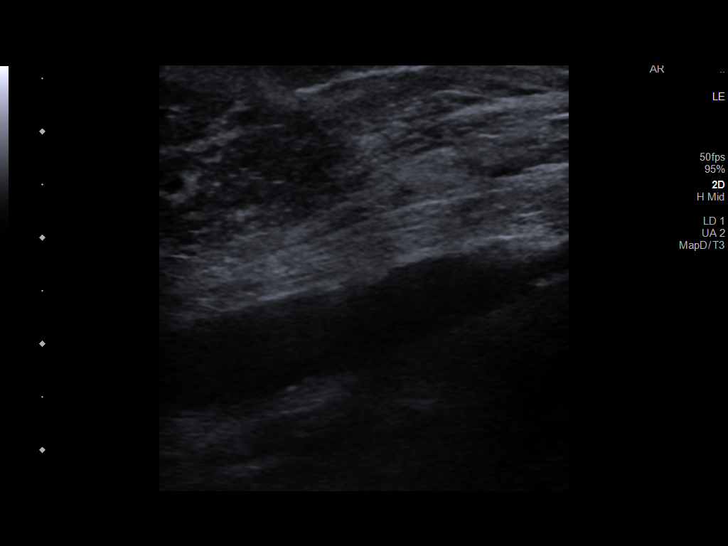
[im 29/40]
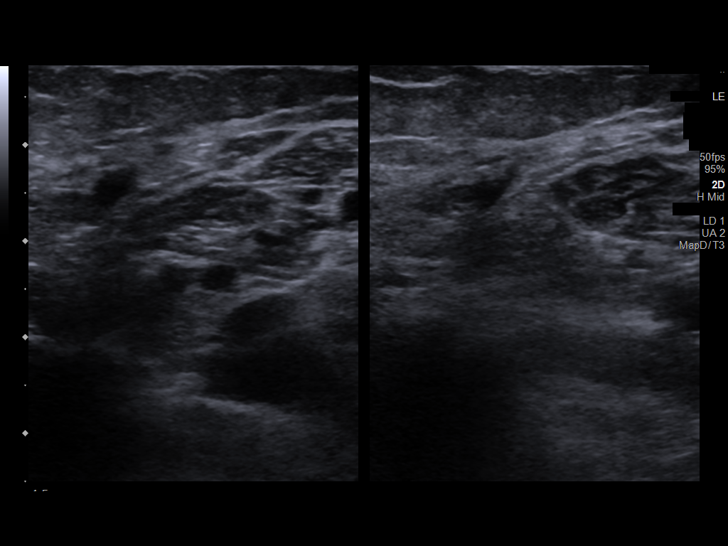
[im 33/40]
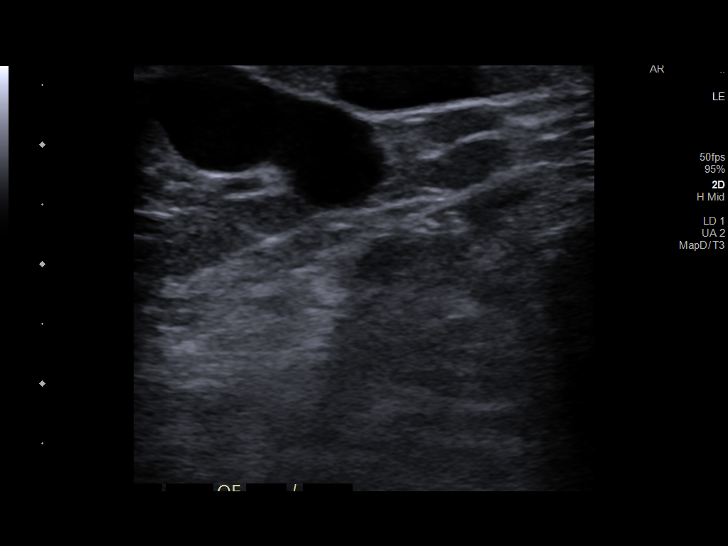
[im 36/40]
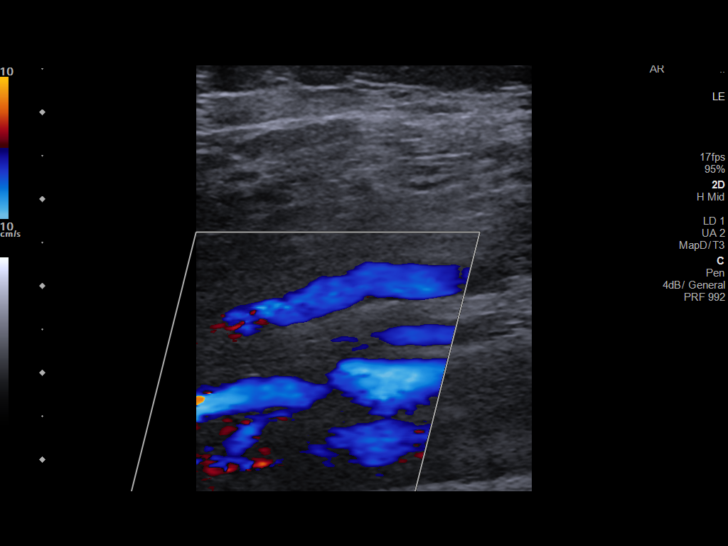
[im 40/40]
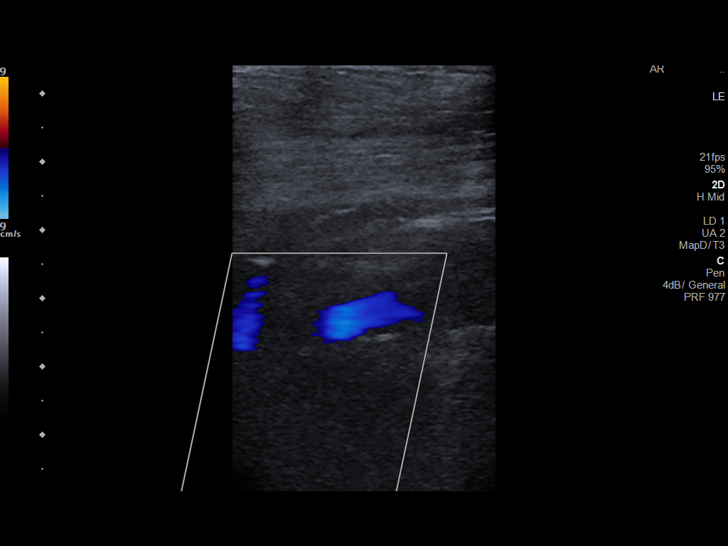

[13 of 24 positions shown; findings below may reference images not displayed]

FINDINGS: Contralateral Common Femoral Vein: Respiratory phasicity is normal
and symmetric with the symptomatic side. No evidence of thrombus.
Normal compressibility.

Common Femoral Vein: No evidence of thrombus. Normal
compressibility, respiratory phasicity and response to augmentation.

Saphenofemoral Junction: No evidence of thrombus. Normal
compressibility and flow on color Doppler imaging.

Profunda Femoral Vein: No evidence of thrombus. Normal
compressibility and flow on color Doppler imaging.

Femoral Vein: No evidence of thrombus. Normal compressibility,
respiratory phasicity and response to augmentation.

Popliteal Vein: No evidence of thrombus. Normal compressibility,
respiratory phasicity and response to augmentation.

Calf Veins: No evidence of thrombus. Normal compressibility and flow
on color Doppler imaging.

Superficial Great Saphenous Vein: No evidence of thrombus. Normal
compressibility.

Venous Reflux:  None.

Other Findings: There are several prominent though widely patent
varicosities which correlate with the patient's area of discomfort
involving the medial aspect of the left calf (images 32 through 36).
IMPRESSION: 1. No evidence of DVT within the left lower extremity.
2. Patient's area of discomfort involving the medial aspect the left
upper calf correlates with several prominent though patent
superficial varicosities. While there is no evidence of superficial
thrombophlebitis, venous insufficiency could serve as an etiology of
the patient's lower extremity pain and edema. Clinical correlation
is advised.

## 2021-11-02 DIAGNOSIS — I10 Essential (primary) hypertension: Secondary | ICD-10-CM | POA: Diagnosis not present

## 2021-11-02 DIAGNOSIS — Z299 Encounter for prophylactic measures, unspecified: Secondary | ICD-10-CM | POA: Diagnosis not present

## 2021-11-02 DIAGNOSIS — R3589 Other polyuria: Secondary | ICD-10-CM | POA: Diagnosis not present

## 2021-11-02 DIAGNOSIS — I4891 Unspecified atrial fibrillation: Secondary | ICD-10-CM | POA: Diagnosis not present

## 2021-11-07 DIAGNOSIS — M79671 Pain in right foot: Secondary | ICD-10-CM | POA: Diagnosis not present

## 2021-11-07 DIAGNOSIS — M79674 Pain in right toe(s): Secondary | ICD-10-CM | POA: Diagnosis not present

## 2021-11-07 DIAGNOSIS — M79675 Pain in left toe(s): Secondary | ICD-10-CM | POA: Diagnosis not present

## 2021-11-07 DIAGNOSIS — I739 Peripheral vascular disease, unspecified: Secondary | ICD-10-CM | POA: Diagnosis not present

## 2021-11-07 DIAGNOSIS — M79672 Pain in left foot: Secondary | ICD-10-CM | POA: Diagnosis not present

## 2021-11-07 DIAGNOSIS — L110 Acquired keratosis follicularis: Secondary | ICD-10-CM | POA: Diagnosis not present

## 2021-11-07 DIAGNOSIS — E1140 Type 2 diabetes mellitus with diabetic neuropathy, unspecified: Secondary | ICD-10-CM | POA: Diagnosis not present

## 2021-11-15 DIAGNOSIS — I10 Essential (primary) hypertension: Secondary | ICD-10-CM | POA: Diagnosis not present

## 2021-11-15 DIAGNOSIS — E782 Mixed hyperlipidemia: Secondary | ICD-10-CM | POA: Diagnosis not present

## 2021-12-01 DIAGNOSIS — Z299 Encounter for prophylactic measures, unspecified: Secondary | ICD-10-CM | POA: Diagnosis not present

## 2021-12-01 DIAGNOSIS — I4891 Unspecified atrial fibrillation: Secondary | ICD-10-CM | POA: Diagnosis not present

## 2021-12-01 DIAGNOSIS — I10 Essential (primary) hypertension: Secondary | ICD-10-CM | POA: Diagnosis not present

## 2021-12-01 DIAGNOSIS — I739 Peripheral vascular disease, unspecified: Secondary | ICD-10-CM | POA: Diagnosis not present

## 2021-12-01 DIAGNOSIS — Z789 Other specified health status: Secondary | ICD-10-CM | POA: Diagnosis not present

## 2021-12-01 DIAGNOSIS — D6869 Other thrombophilia: Secondary | ICD-10-CM | POA: Diagnosis not present

## 2021-12-22 ENCOUNTER — Ambulatory Visit: Payer: Medicare Other | Admitting: Urology

## 2021-12-22 ENCOUNTER — Encounter: Payer: Self-pay | Admitting: Urology

## 2021-12-22 ENCOUNTER — Other Ambulatory Visit: Payer: Self-pay

## 2021-12-22 VITALS — BP 154/79 | HR 73

## 2021-12-22 DIAGNOSIS — Z8546 Personal history of malignant neoplasm of prostate: Secondary | ICD-10-CM

## 2021-12-22 DIAGNOSIS — N5231 Erectile dysfunction following radical prostatectomy: Secondary | ICD-10-CM | POA: Diagnosis not present

## 2021-12-22 DIAGNOSIS — F5232 Male orgasmic disorder: Secondary | ICD-10-CM | POA: Diagnosis not present

## 2021-12-22 DIAGNOSIS — R32 Unspecified urinary incontinence: Secondary | ICD-10-CM | POA: Diagnosis not present

## 2021-12-22 NOTE — Progress Notes (Signed)
? ?Assessment: ?1. History of prostate cancer; s/p radical prostatectomy 2007; PSA undetectable   ?2. Urinary incontinence, unspecified type   ?3. Erectile dysfunction after radical prostatectomy   ?4. Anorgasmia of male   ? ? ? ?Plan: ?Continue current medical therapy for incontinence ?I reviewed his medications.  I do not see any cause for his anorgasmia.  I discussed possible causes including his prior prostate surgery.  Unfortunately, I advised him that I am not aware of any successful treatments for this condition. ?Return to office in 6 months. ? ?Chief Complaint: ?Chief Complaint  ?Patient presents with  ? Prostate Cancer  ? Urinary Incontinence  ? Erectile Dysfunction  ? ? ?HPI: ?Nicholas Berry is a 81 y.o. male who presents for continued evaluation of erectile dysfunction, history of prostate cancer, and urinary incontinence.  ?  ?He is status post a radical prostatectomy in 2007 for management of Gleason 7 prostate cancer.  Pathology showed pT2cN0Mx adenocarcinoma.  By report, his PSAs have been undetectable.  PSA from 4/21:  <0.1. ? ?He does have a history of urinary incontinence and wears pads for management.  He reports voiding with a good stream.  No dysuria or gross hematuria.  He had been previously managed with oxybutynin.  He was changed to Myrbetriq approximately 2 months ago.  He did not feel like the Myrbetriq was improving his symptoms so discontinued the medication.  He continued to have urinary frequency, urgency, and nocturia. ?AUA score = 7. ? ?He has a history of erectile dysfunction since his prostatectomy.  He has previously failed medical therapy for ED. He has been using a VED for the past 15 years with satisfactory results.  He reported a recent problem with decreased sensation of climax.  He is able to achieve an adequate erection with the vacuum erection device.  He reported decreased sensation of climax after starting Myrbetriq.   ? ?At his visit in 11/22, he reported that his  urinary incontinence had not worsened while off of the Myrbetriq for approximately 1 month.  No dysuria or gross hematuria. ?AUA score = 2. ?He had not seen any change in his decreased sensation of climax.  He continued to use a vacuum erection device with satisfactory results.  He was given a prescription for a new device. ? ?He returns today for follow-up.  He is on a mediation for his incontinence and reports good results.  He is not sure if this is Myrbetriq.  He is using the VED with good results.  He continues to have problems with anorgasmia.   ? ?Portions of the above documentation were copied from a prior visit for review purposes only. ? ?Allergies: ?No Known Allergies ? ?PMH: ?Past Medical History:  ?Diagnosis Date  ? Anxiety   ? Colon polyps   ? Essential hypertension   ? GERD (gastroesophageal reflux disease)   ? History of gout   ? Hyperlipidemia   ? Paroxysmal atrial fibrillation (HCC)   ? Prostate cancer ([place])   ? Spinal stenosis   ? Stroke [facility])   ? 1994  ? ? ?PSH: ?Past Surgical History:  ?Procedure Laterality Date  ? BILATERAL DECOMPRESSIVE LAMINECTOMIES & FORAMINOTOMIES L4-L5    ? BILATERAL TOTAL HIP REPLACEMENTS    ? CATARACT EXTRACTION W/PHACO Right 08/27/2017  ? Procedure: CATARACT EXTRACTION PHACO AND INTRAOCULAR LENS PLACEMENT (IOC);  Surgeon: Tonny Branch, MD;  Location: [facility] OR[facility];  Service: Ophthalmology;  Laterality: Right;  CDE: 10.52  ? CATARACT EXTRACTION W/PHACO Left 10/29/2017  ?  Procedure: CATARACT EXTRACTION PHACO AND INTRAOCULAR LENS PLACEMENT (IOC);  Surgeon: Tonny Branch, MD;  Location: [facility];  Service: Ophthalmology;  Laterality: Left;  CDE: 9.84  ? COLONOSCOPY    ? COLONOSCOPY N/A 09/15/2015  ? Procedure: COLONOSCOPY;  Surgeon: Rogene Houston, MD;  Location: [facility] END[facility] SU[facility];  Service: Endoscopy;  Laterality: N/A;  1030  ? ENDOVENOUS ABLATION SAPHENOUS VEIN W/ LASER Left 01/08/2020  ? endovenous laser ablation left greater saphenous vein and stab phlebectomy 10-20 incisions left leg  by Gae Gallop MD   ? ENDOVENOUS ABLATION SAPHENOUS VEIN W/ LASER Right 03/04/2020  ? endovenous laser ablation right greater saphenous vein and stab phlebectomy> 20 incisions right leg by Gae Gallop MD   ? [place]  2007  ? cancer-robotic  ? ? ?SH: ?Social History  ? ?Tobacco Use  ? Smoking status: Never  ? Smokeless tobacco: Never  ?Vaping Use  ? Vaping Use: Never used  ?Substance Use Topics  ? Alcohol use: Yes  ?  Alcohol/week: 14.0 standard drinks  ?  Types: 14 Cans of beer per week  ? Drug use: No  ? ? ?ROS: ?Constitutional:  Negative for fever, chills, weight loss ?CV: Negative for chest pain, previous MI, hypertension ?Respiratory:  Negative for shortness of breath, wheezing, sleep apnea, frequent cough ?GI:  Negative for nausea, vomiting, bloody stool, GERD ? ?PE: ?BP (!) 154/79   Pulse 73  ?GENERAL APPEARANCE:  Well appearing, well developed, well nourished, NAD ?HEENT:  Atraumatic, normocephalic, oropharynx clear ?NECK:  Supple without lymphadenopathy or thyromegaly ?ABDOMEN:  Soft, non-tender, no masses ?EXTREMITIES:  Moves all extremities well, without clubbing, cyanosis, or edema ?NEUROLOGIC:  Alert and oriented x 3, normal gait, CN II-XII grossly intact ?MENTAL STATUS:  appropriate ?BACK:  Non-tender to palpation, No CVAT ?SKIN:  Warm, dry, and intact ? ? ?Results: ?U/A dipstick negative ? ?

## 2021-12-23 LAB — URINALYSIS, ROUTINE W REFLEX MICROSCOPIC
Bilirubin, UA: NEGATIVE
Glucose, UA: NEGATIVE
Ketones, UA: NEGATIVE
Leukocytes,UA: NEGATIVE
Nitrite, UA: NEGATIVE
Protein,UA: NEGATIVE
RBC, UA: NEGATIVE
Specific Gravity, UA: 1.015 (ref 1.005–1.030)
Urobilinogen, Ur: 0.2 mg/dL (ref 0.2–1.0)
pH, UA: 6.5 (ref 5.0–7.5)

## 2022-01-02 DIAGNOSIS — I10 Essential (primary) hypertension: Secondary | ICD-10-CM | POA: Diagnosis not present

## 2022-01-02 DIAGNOSIS — I4891 Unspecified atrial fibrillation: Secondary | ICD-10-CM | POA: Diagnosis not present

## 2022-01-02 DIAGNOSIS — Z299 Encounter for prophylactic measures, unspecified: Secondary | ICD-10-CM | POA: Diagnosis not present

## 2022-01-17 DIAGNOSIS — M79672 Pain in left foot: Secondary | ICD-10-CM | POA: Diagnosis not present

## 2022-01-17 DIAGNOSIS — M79671 Pain in right foot: Secondary | ICD-10-CM | POA: Diagnosis not present

## 2022-01-17 DIAGNOSIS — M79674 Pain in right toe(s): Secondary | ICD-10-CM | POA: Diagnosis not present

## 2022-01-17 DIAGNOSIS — M79675 Pain in left toe(s): Secondary | ICD-10-CM | POA: Diagnosis not present

## 2022-01-17 DIAGNOSIS — E1140 Type 2 diabetes mellitus with diabetic neuropathy, unspecified: Secondary | ICD-10-CM | POA: Diagnosis not present

## 2022-01-17 DIAGNOSIS — I739 Peripheral vascular disease, unspecified: Secondary | ICD-10-CM | POA: Diagnosis not present

## 2022-01-17 DIAGNOSIS — L110 Acquired keratosis follicularis: Secondary | ICD-10-CM | POA: Diagnosis not present

## 2022-02-07 DIAGNOSIS — Z299 Encounter for prophylactic measures, unspecified: Secondary | ICD-10-CM | POA: Diagnosis not present

## 2022-02-07 DIAGNOSIS — Z0000 Encounter for general adult medical examination without abnormal findings: Secondary | ICD-10-CM | POA: Diagnosis not present

## 2022-02-07 DIAGNOSIS — Z7189 Other specified counseling: Secondary | ICD-10-CM | POA: Diagnosis not present

## 2022-02-07 DIAGNOSIS — I10 Essential (primary) hypertension: Secondary | ICD-10-CM | POA: Diagnosis not present

## 2022-02-09 DIAGNOSIS — I10 Essential (primary) hypertension: Secondary | ICD-10-CM | POA: Diagnosis not present

## 2022-02-09 DIAGNOSIS — I4891 Unspecified atrial fibrillation: Secondary | ICD-10-CM | POA: Diagnosis not present

## 2022-02-09 DIAGNOSIS — Z299 Encounter for prophylactic measures, unspecified: Secondary | ICD-10-CM | POA: Diagnosis not present

## 2022-02-20 DIAGNOSIS — M79672 Pain in left foot: Secondary | ICD-10-CM | POA: Diagnosis not present

## 2022-02-20 DIAGNOSIS — M25775 Osteophyte, left foot: Secondary | ICD-10-CM | POA: Diagnosis not present

## 2022-02-20 DIAGNOSIS — L110 Acquired keratosis follicularis: Secondary | ICD-10-CM | POA: Diagnosis not present

## 2022-02-20 DIAGNOSIS — M79671 Pain in right foot: Secondary | ICD-10-CM | POA: Diagnosis not present

## 2022-02-20 DIAGNOSIS — M25774 Osteophyte, right foot: Secondary | ICD-10-CM | POA: Diagnosis not present

## 2022-02-20 DIAGNOSIS — Q828 Other specified congenital malformations of skin: Secondary | ICD-10-CM | POA: Diagnosis not present

## 2022-03-10 DIAGNOSIS — I4891 Unspecified atrial fibrillation: Secondary | ICD-10-CM | POA: Diagnosis not present

## 2022-03-10 DIAGNOSIS — Z299 Encounter for prophylactic measures, unspecified: Secondary | ICD-10-CM | POA: Diagnosis not present

## 2022-03-10 DIAGNOSIS — I10 Essential (primary) hypertension: Secondary | ICD-10-CM | POA: Diagnosis not present

## 2022-03-28 DIAGNOSIS — M79674 Pain in right toe(s): Secondary | ICD-10-CM | POA: Diagnosis not present

## 2022-03-28 DIAGNOSIS — L110 Acquired keratosis follicularis: Secondary | ICD-10-CM | POA: Diagnosis not present

## 2022-03-28 DIAGNOSIS — E1140 Type 2 diabetes mellitus with diabetic neuropathy, unspecified: Secondary | ICD-10-CM | POA: Diagnosis not present

## 2022-03-28 DIAGNOSIS — M79672 Pain in left foot: Secondary | ICD-10-CM | POA: Diagnosis not present

## 2022-03-28 DIAGNOSIS — M79675 Pain in left toe(s): Secondary | ICD-10-CM | POA: Diagnosis not present

## 2022-03-28 DIAGNOSIS — M79671 Pain in right foot: Secondary | ICD-10-CM | POA: Diagnosis not present

## 2022-03-28 DIAGNOSIS — I739 Peripheral vascular disease, unspecified: Secondary | ICD-10-CM | POA: Diagnosis not present

## 2022-04-05 DIAGNOSIS — Z299 Encounter for prophylactic measures, unspecified: Secondary | ICD-10-CM | POA: Diagnosis not present

## 2022-04-05 DIAGNOSIS — I4891 Unspecified atrial fibrillation: Secondary | ICD-10-CM | POA: Diagnosis not present

## 2022-04-13 DIAGNOSIS — R519 Headache, unspecified: Secondary | ICD-10-CM | POA: Diagnosis not present

## 2022-04-13 DIAGNOSIS — I10 Essential (primary) hypertension: Secondary | ICD-10-CM | POA: Diagnosis not present

## 2022-04-13 DIAGNOSIS — R42 Dizziness and giddiness: Secondary | ICD-10-CM | POA: Diagnosis not present

## 2022-04-13 DIAGNOSIS — Z299 Encounter for prophylactic measures, unspecified: Secondary | ICD-10-CM | POA: Diagnosis not present

## 2022-04-13 DIAGNOSIS — Z789 Other specified health status: Secondary | ICD-10-CM | POA: Diagnosis not present

## 2022-04-17 DIAGNOSIS — R42 Dizziness and giddiness: Secondary | ICD-10-CM | POA: Diagnosis not present

## 2022-04-17 DIAGNOSIS — I6523 Occlusion and stenosis of bilateral carotid arteries: Secondary | ICD-10-CM | POA: Diagnosis not present

## 2022-04-19 DIAGNOSIS — Z299 Encounter for prophylactic measures, unspecified: Secondary | ICD-10-CM | POA: Diagnosis not present

## 2022-04-19 DIAGNOSIS — I4891 Unspecified atrial fibrillation: Secondary | ICD-10-CM | POA: Diagnosis not present

## 2022-04-19 DIAGNOSIS — E7800 Pure hypercholesterolemia, unspecified: Secondary | ICD-10-CM | POA: Diagnosis not present

## 2022-04-19 DIAGNOSIS — Z79899 Other long term (current) drug therapy: Secondary | ICD-10-CM | POA: Diagnosis not present

## 2022-04-19 DIAGNOSIS — Z0000 Encounter for general adult medical examination without abnormal findings: Secondary | ICD-10-CM | POA: Diagnosis not present

## 2022-04-19 DIAGNOSIS — I10 Essential (primary) hypertension: Secondary | ICD-10-CM | POA: Diagnosis not present

## 2022-04-19 DIAGNOSIS — R5383 Other fatigue: Secondary | ICD-10-CM | POA: Diagnosis not present

## 2022-05-09 DIAGNOSIS — M79672 Pain in left foot: Secondary | ICD-10-CM | POA: Diagnosis not present

## 2022-05-09 DIAGNOSIS — L110 Acquired keratosis follicularis: Secondary | ICD-10-CM | POA: Diagnosis not present

## 2022-05-09 DIAGNOSIS — M79675 Pain in left toe(s): Secondary | ICD-10-CM | POA: Diagnosis not present

## 2022-05-09 DIAGNOSIS — I739 Peripheral vascular disease, unspecified: Secondary | ICD-10-CM | POA: Diagnosis not present

## 2022-05-09 DIAGNOSIS — E1140 Type 2 diabetes mellitus with diabetic neuropathy, unspecified: Secondary | ICD-10-CM | POA: Diagnosis not present

## 2022-05-09 DIAGNOSIS — M79674 Pain in right toe(s): Secondary | ICD-10-CM | POA: Diagnosis not present

## 2022-05-09 DIAGNOSIS — L89892 Pressure ulcer of other site, stage 2: Secondary | ICD-10-CM | POA: Diagnosis not present

## 2022-05-09 DIAGNOSIS — M79671 Pain in right foot: Secondary | ICD-10-CM | POA: Diagnosis not present

## 2022-05-15 DIAGNOSIS — E7800 Pure hypercholesterolemia, unspecified: Secondary | ICD-10-CM | POA: Diagnosis not present

## 2022-05-15 DIAGNOSIS — I10 Essential (primary) hypertension: Secondary | ICD-10-CM | POA: Diagnosis not present

## 2022-05-18 DIAGNOSIS — I4891 Unspecified atrial fibrillation: Secondary | ICD-10-CM | POA: Diagnosis not present

## 2022-05-18 DIAGNOSIS — I10 Essential (primary) hypertension: Secondary | ICD-10-CM | POA: Diagnosis not present

## 2022-05-18 DIAGNOSIS — Z299 Encounter for prophylactic measures, unspecified: Secondary | ICD-10-CM | POA: Diagnosis not present

## 2022-05-23 DIAGNOSIS — L110 Acquired keratosis follicularis: Secondary | ICD-10-CM | POA: Diagnosis not present

## 2022-05-23 DIAGNOSIS — M79672 Pain in left foot: Secondary | ICD-10-CM | POA: Diagnosis not present

## 2022-05-23 DIAGNOSIS — M79674 Pain in right toe(s): Secondary | ICD-10-CM | POA: Diagnosis not present

## 2022-05-23 DIAGNOSIS — M79671 Pain in right foot: Secondary | ICD-10-CM | POA: Diagnosis not present

## 2022-05-23 DIAGNOSIS — E1140 Type 2 diabetes mellitus with diabetic neuropathy, unspecified: Secondary | ICD-10-CM | POA: Diagnosis not present

## 2022-05-23 DIAGNOSIS — M79675 Pain in left toe(s): Secondary | ICD-10-CM | POA: Diagnosis not present

## 2022-05-23 DIAGNOSIS — L89892 Pressure ulcer of other site, stage 2: Secondary | ICD-10-CM | POA: Diagnosis not present

## 2022-06-06 DIAGNOSIS — L89892 Pressure ulcer of other site, stage 2: Secondary | ICD-10-CM | POA: Diagnosis not present

## 2022-06-06 DIAGNOSIS — M79671 Pain in right foot: Secondary | ICD-10-CM | POA: Diagnosis not present

## 2022-06-06 DIAGNOSIS — M79674 Pain in right toe(s): Secondary | ICD-10-CM | POA: Diagnosis not present

## 2022-06-06 DIAGNOSIS — L110 Acquired keratosis follicularis: Secondary | ICD-10-CM | POA: Diagnosis not present

## 2022-06-06 DIAGNOSIS — E1140 Type 2 diabetes mellitus with diabetic neuropathy, unspecified: Secondary | ICD-10-CM | POA: Diagnosis not present

## 2022-06-15 DIAGNOSIS — I10 Essential (primary) hypertension: Secondary | ICD-10-CM | POA: Diagnosis not present

## 2022-06-15 DIAGNOSIS — Z789 Other specified health status: Secondary | ICD-10-CM | POA: Diagnosis not present

## 2022-06-15 DIAGNOSIS — I4891 Unspecified atrial fibrillation: Secondary | ICD-10-CM | POA: Diagnosis not present

## 2022-06-15 DIAGNOSIS — Z299 Encounter for prophylactic measures, unspecified: Secondary | ICD-10-CM | POA: Diagnosis not present

## 2022-06-20 DIAGNOSIS — L110 Acquired keratosis follicularis: Secondary | ICD-10-CM | POA: Diagnosis not present

## 2022-06-20 DIAGNOSIS — E1140 Type 2 diabetes mellitus with diabetic neuropathy, unspecified: Secondary | ICD-10-CM | POA: Diagnosis not present

## 2022-06-20 DIAGNOSIS — M79672 Pain in left foot: Secondary | ICD-10-CM | POA: Diagnosis not present

## 2022-06-20 DIAGNOSIS — M79675 Pain in left toe(s): Secondary | ICD-10-CM | POA: Diagnosis not present

## 2022-06-20 DIAGNOSIS — M79674 Pain in right toe(s): Secondary | ICD-10-CM | POA: Diagnosis not present

## 2022-06-20 DIAGNOSIS — L89892 Pressure ulcer of other site, stage 2: Secondary | ICD-10-CM | POA: Diagnosis not present

## 2022-06-20 DIAGNOSIS — M79671 Pain in right foot: Secondary | ICD-10-CM | POA: Diagnosis not present

## 2022-06-22 ENCOUNTER — Encounter: Payer: Self-pay | Admitting: Urology

## 2022-06-22 ENCOUNTER — Ambulatory Visit: Payer: Medicare Other | Admitting: Urology

## 2022-06-22 VITALS — BP 131/77 | HR 75 | Ht 71.0 in | Wt 188.0 lb

## 2022-06-22 DIAGNOSIS — Z8546 Personal history of malignant neoplasm of prostate: Secondary | ICD-10-CM | POA: Diagnosis not present

## 2022-06-22 DIAGNOSIS — R32 Unspecified urinary incontinence: Secondary | ICD-10-CM

## 2022-06-22 DIAGNOSIS — N5231 Erectile dysfunction following radical prostatectomy: Secondary | ICD-10-CM

## 2022-06-22 LAB — URINALYSIS, ROUTINE W REFLEX MICROSCOPIC
Bilirubin, UA: NEGATIVE
Glucose, UA: NEGATIVE
Ketones, UA: NEGATIVE
Nitrite, UA: NEGATIVE
Protein,UA: NEGATIVE
RBC, UA: NEGATIVE
Specific Gravity, UA: 1.015 (ref 1.005–1.030)
Urobilinogen, Ur: 0.2 mg/dL (ref 0.2–1.0)
pH, UA: 5 (ref 5.0–7.5)

## 2022-06-22 LAB — MICROSCOPIC EXAMINATION
RBC, Urine: NONE SEEN /hpf (ref 0–2)
Renal Epithel, UA: NONE SEEN /hpf

## 2022-06-22 NOTE — Progress Notes (Signed)
Assessment: 1. History of prostate cancer; s/p radical prostatectomy 2007; PSA undetectable   2. Urinary incontinence, unspecified type   3. Erectile dysfunction after radical prostatectomy     Plan: His urologic issues are currently stable. He is 16 years status post radical prostatectomy for prostate cancer with an undetectable PSA 2 years ago. Return to office in 1 year.  Chief Complaint: Chief Complaint  Patient presents with   Urinary Incontinence    HPI: Nicholas Berry is a 81 y.o. male who presents for continued evaluation of erectile dysfunction, history of prostate cancer, and urinary incontinence.    He is status post a radical prostatectomy in 2007 for management of Gleason 7 prostate cancer.  Pathology showed pT2cN0Mx adenocarcinoma.  By report, his PSAs have been undetectable.  PSA from 4/21:  <0.1.  He does have a history of urinary incontinence and wears pads for management.  He reports voiding with a good stream.  No dysuria or gross hematuria.  He had been previously managed with oxybutynin.  He was changed to Myrbetriq approximately 2 months ago.  He did not feel like the Myrbetriq was improving his symptoms so discontinued the medication.  He continued to have urinary frequency, urgency, and nocturia. AUA score = 7.  He has a history of erectile dysfunction since his prostatectomy.  He has previously failed medical therapy for ED. He has been using a VED for the past 15 years with satisfactory results.  He reported a recent problem with decreased sensation of climax.  He is able to achieve an adequate erection with the vacuum erection device.  He reported decreased sensation of climax after starting Myrbetriq.    At his visit in 11/22, he reported that his urinary incontinence had not worsened while off of the Myrbetriq for approximately 1 month.  No dysuria or gross hematuria. AUA score = 2. He had not seen any change in his decreased sensation of climax.  He  continued to use a vacuum erection device with satisfactory results.  He was given a prescription for a new device.  He returns today for follow-up.  He reports that his incontinence is fairly stable.  He is not taking Myrbetriq.  He only has incontinence associated with straining.  His erectile dysfunction is stable he continues to use the VED with good results.  No dysuria or gross hematuria.   Portions of the above documentation were copied from a prior visit for review purposes only.  Allergies: No Known Allergies  PMH: Past Medical History:  Diagnosis Date   Anxiety    Colon polyps    Essential hypertension    GERD (gastroesophageal reflux disease)    History of gout    Hyperlipidemia    Paroxysmal atrial fibrillation (HCC)    Prostate cancer ([place])    Spinal stenosis    Stroke ([place])    1994    PSH: Past Surgical History:  Procedure Laterality Date   BILATERAL DECOMPRESSIVE LAMINECTOMIES & FORAMINOTOMIES L4-L5     BILATERAL TOTAL HIP REPLACEMENTS     CATARACT EXTRACTION W/PHACO Right 08/27/2017   Procedure: CATARACT EXTRACTION PHACO AND INTRAOCULAR LENS PLACEMENT ([place]);  Surgeon: Tonny Branch, MD;  Location: [facility];  Service: Ophthalmology;  Laterality: Right;  CDE: 10.52   CATARACT EXTRACTION W/PHACO Left 10/29/2017   Procedure: CATARACT EXTRACTION PHACO AND INTRAOCULAR LENS PLACEMENT (IOC);  Surgeon: Tonny Branch, MD;  Location: [facility]S;  Service: Ophthalmology;  Laterality: Left;  CDE: 9.84   COLONOSCOPY  COLONOSCOPY N/A 09/15/2015   Procedure: COLONOSCOPY;  Surgeon: Rogene Houston, MD;  Location[facility];  Service: Endoscopy;  Laterality: N/A;  1030   ENDOVENOUS ABLATION SAPHENOUS VEIN W/ LASER Left 01/08/2020   endovenous laser ablation left greater saphenous vein and stab phlebectomy 10-20 incisions left leg by Gae Gallop MD    ENDOVENOUS [place] W/ LASER Right 03/04/2020   endovenous laser ablation right greater saphenous vein and stab  phlebectomy> 20 incisions right leg by Gae Gallop MD    PROSTATE SURGERY  2007   cancer-robotic    SH: Social History   Tobacco Use   Smoking status: Never   Smokeless tobacco: Never  Vaping Use   Vaping Use: Never used  Substance Use Topics   Alcohol use: Yes    Alcohol/week: 14.0 standard drinks of alcohol    Types: 14 Cans of beer per week   Drug use: No    ROS: Constitutional:  Negative for fever, chills, weight loss CV: Negative for chest pain, previous MI, hypertension Respiratory:  Negative for shortness of breath, wheezing, sleep apnea, frequent cough GI:  Negative for nausea, vomiting, bloody stool, GERD   PE: BP 131/77   Pulse 75   Ht '5\' 11"'$  (1.803 m)   Wt 188 lb (85.3 kg)   BMI 26.22 kg/m  GENERAL APPEARANCE:  Well appearing, well developed, well nourished, NAD HEENT:  Atraumatic, normocephalic, oropharynx clear NECK:  Supple without lymphadenopathy or thyromegaly ABDOMEN:  Soft, non-tender, no masses EXTREMITIES:  Moves all extremities well, without clubbing, cyanosis, or edema NEUROLOGIC:  Alert and oriented x 3, normal gait, CN II-XII grossly intact MENTAL STATUS:  appropriate BACK:  Non-tender to palpation, No CVAT SKIN:  Warm, dry, and intact   Results: U/A: 6-10 WBCs, few bacteria

## 2022-07-04 DIAGNOSIS — E1140 Type 2 diabetes mellitus with diabetic neuropathy, unspecified: Secondary | ICD-10-CM | POA: Diagnosis not present

## 2022-07-04 DIAGNOSIS — M79672 Pain in left foot: Secondary | ICD-10-CM | POA: Diagnosis not present

## 2022-07-04 DIAGNOSIS — L89892 Pressure ulcer of other site, stage 2: Secondary | ICD-10-CM | POA: Diagnosis not present

## 2022-07-04 DIAGNOSIS — L110 Acquired keratosis follicularis: Secondary | ICD-10-CM | POA: Diagnosis not present

## 2022-07-04 DIAGNOSIS — M79671 Pain in right foot: Secondary | ICD-10-CM | POA: Diagnosis not present

## 2022-07-04 DIAGNOSIS — M79674 Pain in right toe(s): Secondary | ICD-10-CM | POA: Diagnosis not present

## 2022-07-04 DIAGNOSIS — M79675 Pain in left toe(s): Secondary | ICD-10-CM | POA: Diagnosis not present

## 2022-07-13 DIAGNOSIS — I10 Essential (primary) hypertension: Secondary | ICD-10-CM | POA: Diagnosis not present

## 2022-07-13 DIAGNOSIS — Z299 Encounter for prophylactic measures, unspecified: Secondary | ICD-10-CM | POA: Diagnosis not present

## 2022-07-13 DIAGNOSIS — I4891 Unspecified atrial fibrillation: Secondary | ICD-10-CM | POA: Diagnosis not present

## 2022-07-25 DIAGNOSIS — M79671 Pain in right foot: Secondary | ICD-10-CM | POA: Diagnosis not present

## 2022-07-25 DIAGNOSIS — M79672 Pain in left foot: Secondary | ICD-10-CM | POA: Diagnosis not present

## 2022-07-25 DIAGNOSIS — M79674 Pain in right toe(s): Secondary | ICD-10-CM | POA: Diagnosis not present

## 2022-07-25 DIAGNOSIS — M79675 Pain in left toe(s): Secondary | ICD-10-CM | POA: Diagnosis not present

## 2022-07-25 DIAGNOSIS — L89892 Pressure ulcer of other site, stage 2: Secondary | ICD-10-CM | POA: Diagnosis not present

## 2022-07-25 DIAGNOSIS — L110 Acquired keratosis follicularis: Secondary | ICD-10-CM | POA: Diagnosis not present

## 2022-07-25 DIAGNOSIS — E1140 Type 2 diabetes mellitus with diabetic neuropathy, unspecified: Secondary | ICD-10-CM | POA: Diagnosis not present

## 2022-07-31 DIAGNOSIS — I739 Peripheral vascular disease, unspecified: Secondary | ICD-10-CM | POA: Diagnosis not present

## 2022-07-31 DIAGNOSIS — E1140 Type 2 diabetes mellitus with diabetic neuropathy, unspecified: Secondary | ICD-10-CM | POA: Diagnosis not present

## 2022-07-31 DIAGNOSIS — L110 Acquired keratosis follicularis: Secondary | ICD-10-CM | POA: Diagnosis not present

## 2022-07-31 DIAGNOSIS — M79675 Pain in left toe(s): Secondary | ICD-10-CM | POA: Diagnosis not present

## 2022-07-31 DIAGNOSIS — M79674 Pain in right toe(s): Secondary | ICD-10-CM | POA: Diagnosis not present

## 2022-07-31 DIAGNOSIS — M79672 Pain in left foot: Secondary | ICD-10-CM | POA: Diagnosis not present

## 2022-07-31 DIAGNOSIS — M79671 Pain in right foot: Secondary | ICD-10-CM | POA: Diagnosis not present

## 2022-08-14 DIAGNOSIS — Z299 Encounter for prophylactic measures, unspecified: Secondary | ICD-10-CM | POA: Diagnosis not present

## 2022-08-14 DIAGNOSIS — I10 Essential (primary) hypertension: Secondary | ICD-10-CM | POA: Diagnosis not present

## 2022-08-14 DIAGNOSIS — I4891 Unspecified atrial fibrillation: Secondary | ICD-10-CM | POA: Diagnosis not present

## 2022-08-15 DIAGNOSIS — M79674 Pain in right toe(s): Secondary | ICD-10-CM | POA: Diagnosis not present

## 2022-08-15 DIAGNOSIS — E1140 Type 2 diabetes mellitus with diabetic neuropathy, unspecified: Secondary | ICD-10-CM | POA: Diagnosis not present

## 2022-08-15 DIAGNOSIS — M79672 Pain in left foot: Secondary | ICD-10-CM | POA: Diagnosis not present

## 2022-08-15 DIAGNOSIS — L89892 Pressure ulcer of other site, stage 2: Secondary | ICD-10-CM | POA: Diagnosis not present

## 2022-08-15 DIAGNOSIS — L110 Acquired keratosis follicularis: Secondary | ICD-10-CM | POA: Diagnosis not present

## 2022-08-15 DIAGNOSIS — M79675 Pain in left toe(s): Secondary | ICD-10-CM | POA: Diagnosis not present

## 2022-08-15 DIAGNOSIS — M79671 Pain in right foot: Secondary | ICD-10-CM | POA: Diagnosis not present

## 2022-09-05 DIAGNOSIS — E1140 Type 2 diabetes mellitus with diabetic neuropathy, unspecified: Secondary | ICD-10-CM | POA: Diagnosis not present

## 2022-09-05 DIAGNOSIS — L89892 Pressure ulcer of other site, stage 2: Secondary | ICD-10-CM | POA: Diagnosis not present

## 2022-09-05 DIAGNOSIS — M79672 Pain in left foot: Secondary | ICD-10-CM | POA: Diagnosis not present

## 2022-09-05 DIAGNOSIS — L110 Acquired keratosis follicularis: Secondary | ICD-10-CM | POA: Diagnosis not present

## 2022-09-05 DIAGNOSIS — M79671 Pain in right foot: Secondary | ICD-10-CM | POA: Diagnosis not present

## 2022-09-05 DIAGNOSIS — M79675 Pain in left toe(s): Secondary | ICD-10-CM | POA: Diagnosis not present

## 2022-09-05 DIAGNOSIS — M79674 Pain in right toe(s): Secondary | ICD-10-CM | POA: Diagnosis not present

## 2022-09-11 DIAGNOSIS — I4891 Unspecified atrial fibrillation: Secondary | ICD-10-CM | POA: Diagnosis not present

## 2022-09-11 DIAGNOSIS — I10 Essential (primary) hypertension: Secondary | ICD-10-CM | POA: Diagnosis not present

## 2022-09-11 DIAGNOSIS — Z299 Encounter for prophylactic measures, unspecified: Secondary | ICD-10-CM | POA: Diagnosis not present

## 2022-09-26 DIAGNOSIS — M79672 Pain in left foot: Secondary | ICD-10-CM | POA: Diagnosis not present

## 2022-09-26 DIAGNOSIS — M79674 Pain in right toe(s): Secondary | ICD-10-CM | POA: Diagnosis not present

## 2022-09-26 DIAGNOSIS — E1140 Type 2 diabetes mellitus with diabetic neuropathy, unspecified: Secondary | ICD-10-CM | POA: Diagnosis not present

## 2022-09-26 DIAGNOSIS — M79675 Pain in left toe(s): Secondary | ICD-10-CM | POA: Diagnosis not present

## 2022-09-26 DIAGNOSIS — L89892 Pressure ulcer of other site, stage 2: Secondary | ICD-10-CM | POA: Diagnosis not present

## 2022-09-26 DIAGNOSIS — M79671 Pain in right foot: Secondary | ICD-10-CM | POA: Diagnosis not present

## 2022-09-26 DIAGNOSIS — L110 Acquired keratosis follicularis: Secondary | ICD-10-CM | POA: Diagnosis not present

## 2022-10-11 DIAGNOSIS — I4891 Unspecified atrial fibrillation: Secondary | ICD-10-CM | POA: Diagnosis not present

## 2022-10-11 DIAGNOSIS — Z299 Encounter for prophylactic measures, unspecified: Secondary | ICD-10-CM | POA: Diagnosis not present

## 2022-10-11 DIAGNOSIS — I10 Essential (primary) hypertension: Secondary | ICD-10-CM | POA: Diagnosis not present

## 2022-10-17 DIAGNOSIS — M79672 Pain in left foot: Secondary | ICD-10-CM | POA: Diagnosis not present

## 2022-10-17 DIAGNOSIS — M79671 Pain in right foot: Secondary | ICD-10-CM | POA: Diagnosis not present

## 2022-10-17 DIAGNOSIS — L110 Acquired keratosis follicularis: Secondary | ICD-10-CM | POA: Diagnosis not present

## 2022-10-17 DIAGNOSIS — M79674 Pain in right toe(s): Secondary | ICD-10-CM | POA: Diagnosis not present

## 2022-10-17 DIAGNOSIS — E1140 Type 2 diabetes mellitus with diabetic neuropathy, unspecified: Secondary | ICD-10-CM | POA: Diagnosis not present

## 2022-10-17 DIAGNOSIS — I739 Peripheral vascular disease, unspecified: Secondary | ICD-10-CM | POA: Diagnosis not present

## 2022-10-17 DIAGNOSIS — M79675 Pain in left toe(s): Secondary | ICD-10-CM | POA: Diagnosis not present

## 2022-11-09 DIAGNOSIS — D6869 Other thrombophilia: Secondary | ICD-10-CM | POA: Diagnosis not present

## 2022-11-09 DIAGNOSIS — Z299 Encounter for prophylactic measures, unspecified: Secondary | ICD-10-CM | POA: Diagnosis not present

## 2022-11-09 DIAGNOSIS — I10 Essential (primary) hypertension: Secondary | ICD-10-CM | POA: Diagnosis not present

## 2022-11-09 DIAGNOSIS — I4891 Unspecified atrial fibrillation: Secondary | ICD-10-CM | POA: Diagnosis not present

## 2022-12-08 DIAGNOSIS — Z299 Encounter for prophylactic measures, unspecified: Secondary | ICD-10-CM | POA: Diagnosis not present

## 2022-12-08 DIAGNOSIS — I10 Essential (primary) hypertension: Secondary | ICD-10-CM | POA: Diagnosis not present

## 2022-12-08 DIAGNOSIS — I4891 Unspecified atrial fibrillation: Secondary | ICD-10-CM | POA: Diagnosis not present

## 2022-12-26 DIAGNOSIS — E1140 Type 2 diabetes mellitus with diabetic neuropathy, unspecified: Secondary | ICD-10-CM | POA: Diagnosis not present

## 2022-12-26 DIAGNOSIS — M79672 Pain in left foot: Secondary | ICD-10-CM | POA: Diagnosis not present

## 2022-12-26 DIAGNOSIS — L110 Acquired keratosis follicularis: Secondary | ICD-10-CM | POA: Diagnosis not present

## 2022-12-26 DIAGNOSIS — M79675 Pain in left toe(s): Secondary | ICD-10-CM | POA: Diagnosis not present

## 2022-12-26 DIAGNOSIS — M79671 Pain in right foot: Secondary | ICD-10-CM | POA: Diagnosis not present

## 2022-12-26 DIAGNOSIS — I739 Peripheral vascular disease, unspecified: Secondary | ICD-10-CM | POA: Diagnosis not present

## 2022-12-26 DIAGNOSIS — M79674 Pain in right toe(s): Secondary | ICD-10-CM | POA: Diagnosis not present

## 2023-01-05 DIAGNOSIS — I10 Essential (primary) hypertension: Secondary | ICD-10-CM | POA: Diagnosis not present

## 2023-01-05 DIAGNOSIS — Z299 Encounter for prophylactic measures, unspecified: Secondary | ICD-10-CM | POA: Diagnosis not present

## 2023-01-05 DIAGNOSIS — I4891 Unspecified atrial fibrillation: Secondary | ICD-10-CM | POA: Diagnosis not present

## 2023-01-19 DIAGNOSIS — Z299 Encounter for prophylactic measures, unspecified: Secondary | ICD-10-CM | POA: Diagnosis not present

## 2023-01-19 DIAGNOSIS — I10 Essential (primary) hypertension: Secondary | ICD-10-CM | POA: Diagnosis not present

## 2023-02-13 DIAGNOSIS — Z0000 Encounter for general adult medical examination without abnormal findings: Secondary | ICD-10-CM | POA: Diagnosis not present

## 2023-02-13 DIAGNOSIS — I4891 Unspecified atrial fibrillation: Secondary | ICD-10-CM | POA: Diagnosis not present

## 2023-02-13 DIAGNOSIS — Z299 Encounter for prophylactic measures, unspecified: Secondary | ICD-10-CM | POA: Diagnosis not present

## 2023-02-13 DIAGNOSIS — Z7189 Other specified counseling: Secondary | ICD-10-CM | POA: Diagnosis not present

## 2023-02-13 DIAGNOSIS — I739 Peripheral vascular disease, unspecified: Secondary | ICD-10-CM | POA: Diagnosis not present

## 2023-02-13 DIAGNOSIS — I10 Essential (primary) hypertension: Secondary | ICD-10-CM | POA: Diagnosis not present

## 2023-03-06 DIAGNOSIS — M79675 Pain in left toe(s): Secondary | ICD-10-CM | POA: Diagnosis not present

## 2023-03-06 DIAGNOSIS — I739 Peripheral vascular disease, unspecified: Secondary | ICD-10-CM | POA: Diagnosis not present

## 2023-03-06 DIAGNOSIS — E1140 Type 2 diabetes mellitus with diabetic neuropathy, unspecified: Secondary | ICD-10-CM | POA: Diagnosis not present

## 2023-03-06 DIAGNOSIS — M79672 Pain in left foot: Secondary | ICD-10-CM | POA: Diagnosis not present

## 2023-03-06 DIAGNOSIS — M79674 Pain in right toe(s): Secondary | ICD-10-CM | POA: Diagnosis not present

## 2023-03-06 DIAGNOSIS — L110 Acquired keratosis follicularis: Secondary | ICD-10-CM | POA: Diagnosis not present

## 2023-03-06 DIAGNOSIS — M79671 Pain in right foot: Secondary | ICD-10-CM | POA: Diagnosis not present

## 2023-03-19 DIAGNOSIS — R233 Spontaneous ecchymoses: Secondary | ICD-10-CM | POA: Diagnosis not present

## 2023-03-19 DIAGNOSIS — I4891 Unspecified atrial fibrillation: Secondary | ICD-10-CM | POA: Diagnosis not present

## 2023-03-19 DIAGNOSIS — I10 Essential (primary) hypertension: Secondary | ICD-10-CM | POA: Diagnosis not present

## 2023-03-19 DIAGNOSIS — Z299 Encounter for prophylactic measures, unspecified: Secondary | ICD-10-CM | POA: Diagnosis not present

## 2023-04-03 DIAGNOSIS — I10 Essential (primary) hypertension: Secondary | ICD-10-CM | POA: Diagnosis not present

## 2023-04-03 DIAGNOSIS — K921 Melena: Secondary | ICD-10-CM | POA: Diagnosis not present

## 2023-04-03 DIAGNOSIS — I4891 Unspecified atrial fibrillation: Secondary | ICD-10-CM | POA: Diagnosis not present

## 2023-04-03 DIAGNOSIS — Z299 Encounter for prophylactic measures, unspecified: Secondary | ICD-10-CM | POA: Diagnosis not present

## 2023-04-27 DIAGNOSIS — Z299 Encounter for prophylactic measures, unspecified: Secondary | ICD-10-CM | POA: Diagnosis not present

## 2023-04-27 DIAGNOSIS — Z79899 Other long term (current) drug therapy: Secondary | ICD-10-CM | POA: Diagnosis not present

## 2023-04-27 DIAGNOSIS — R5383 Other fatigue: Secondary | ICD-10-CM | POA: Diagnosis not present

## 2023-04-27 DIAGNOSIS — Z0000 Encounter for general adult medical examination without abnormal findings: Secondary | ICD-10-CM | POA: Diagnosis not present

## 2023-04-27 DIAGNOSIS — E7800 Pure hypercholesterolemia, unspecified: Secondary | ICD-10-CM | POA: Diagnosis not present

## 2023-04-27 DIAGNOSIS — I10 Essential (primary) hypertension: Secondary | ICD-10-CM | POA: Diagnosis not present

## 2023-05-01 DIAGNOSIS — I10 Essential (primary) hypertension: Secondary | ICD-10-CM | POA: Diagnosis not present

## 2023-05-01 DIAGNOSIS — Z299 Encounter for prophylactic measures, unspecified: Secondary | ICD-10-CM | POA: Diagnosis not present

## 2023-05-01 DIAGNOSIS — R233 Spontaneous ecchymoses: Secondary | ICD-10-CM | POA: Diagnosis not present

## 2023-05-01 DIAGNOSIS — I4891 Unspecified atrial fibrillation: Secondary | ICD-10-CM | POA: Diagnosis not present

## 2023-05-14 DIAGNOSIS — I739 Peripheral vascular disease, unspecified: Secondary | ICD-10-CM | POA: Diagnosis not present

## 2023-05-14 DIAGNOSIS — E1140 Type 2 diabetes mellitus with diabetic neuropathy, unspecified: Secondary | ICD-10-CM | POA: Diagnosis not present

## 2023-05-14 DIAGNOSIS — L110 Acquired keratosis follicularis: Secondary | ICD-10-CM | POA: Diagnosis not present

## 2023-05-14 DIAGNOSIS — M79672 Pain in left foot: Secondary | ICD-10-CM | POA: Diagnosis not present

## 2023-05-14 DIAGNOSIS — M79674 Pain in right toe(s): Secondary | ICD-10-CM | POA: Diagnosis not present

## 2023-05-14 DIAGNOSIS — M79675 Pain in left toe(s): Secondary | ICD-10-CM | POA: Diagnosis not present

## 2023-05-14 DIAGNOSIS — M79671 Pain in right foot: Secondary | ICD-10-CM | POA: Diagnosis not present

## 2023-05-29 DIAGNOSIS — Z299 Encounter for prophylactic measures, unspecified: Secondary | ICD-10-CM | POA: Diagnosis not present

## 2023-05-29 DIAGNOSIS — I4891 Unspecified atrial fibrillation: Secondary | ICD-10-CM | POA: Diagnosis not present

## 2023-05-29 DIAGNOSIS — I10 Essential (primary) hypertension: Secondary | ICD-10-CM | POA: Diagnosis not present

## 2023-06-25 ENCOUNTER — Ambulatory Visit: Payer: Medicare Other | Admitting: Urology

## 2023-06-27 DIAGNOSIS — I10 Essential (primary) hypertension: Secondary | ICD-10-CM | POA: Diagnosis not present

## 2023-06-27 DIAGNOSIS — R233 Spontaneous ecchymoses: Secondary | ICD-10-CM | POA: Diagnosis not present

## 2023-06-27 DIAGNOSIS — I4891 Unspecified atrial fibrillation: Secondary | ICD-10-CM | POA: Diagnosis not present

## 2023-06-27 DIAGNOSIS — Z299 Encounter for prophylactic measures, unspecified: Secondary | ICD-10-CM | POA: Diagnosis not present

## 2023-06-27 DIAGNOSIS — L97519 Non-pressure chronic ulcer of other part of right foot with unspecified severity: Secondary | ICD-10-CM | POA: Diagnosis not present

## 2023-07-03 DIAGNOSIS — M79674 Pain in right toe(s): Secondary | ICD-10-CM | POA: Diagnosis not present

## 2023-07-03 DIAGNOSIS — M79671 Pain in right foot: Secondary | ICD-10-CM | POA: Diagnosis not present

## 2023-07-03 DIAGNOSIS — L110 Acquired keratosis follicularis: Secondary | ICD-10-CM | POA: Diagnosis not present

## 2023-07-03 DIAGNOSIS — M79672 Pain in left foot: Secondary | ICD-10-CM | POA: Diagnosis not present

## 2023-07-03 DIAGNOSIS — M79675 Pain in left toe(s): Secondary | ICD-10-CM | POA: Diagnosis not present

## 2023-07-03 DIAGNOSIS — E1140 Type 2 diabetes mellitus with diabetic neuropathy, unspecified: Secondary | ICD-10-CM | POA: Diagnosis not present

## 2023-07-03 DIAGNOSIS — L89892 Pressure ulcer of other site, stage 2: Secondary | ICD-10-CM | POA: Diagnosis not present

## 2023-07-17 DIAGNOSIS — L89892 Pressure ulcer of other site, stage 2: Secondary | ICD-10-CM | POA: Diagnosis not present

## 2023-07-17 DIAGNOSIS — M79671 Pain in right foot: Secondary | ICD-10-CM | POA: Diagnosis not present

## 2023-07-17 DIAGNOSIS — M79674 Pain in right toe(s): Secondary | ICD-10-CM | POA: Diagnosis not present

## 2023-07-17 DIAGNOSIS — L110 Acquired keratosis follicularis: Secondary | ICD-10-CM | POA: Diagnosis not present

## 2023-07-17 DIAGNOSIS — E1140 Type 2 diabetes mellitus with diabetic neuropathy, unspecified: Secondary | ICD-10-CM | POA: Diagnosis not present

## 2023-07-23 DIAGNOSIS — E1140 Type 2 diabetes mellitus with diabetic neuropathy, unspecified: Secondary | ICD-10-CM | POA: Diagnosis not present

## 2023-07-23 DIAGNOSIS — M79671 Pain in right foot: Secondary | ICD-10-CM | POA: Diagnosis not present

## 2023-07-23 DIAGNOSIS — M79675 Pain in left toe(s): Secondary | ICD-10-CM | POA: Diagnosis not present

## 2023-07-23 DIAGNOSIS — I739 Peripheral vascular disease, unspecified: Secondary | ICD-10-CM | POA: Diagnosis not present

## 2023-07-23 DIAGNOSIS — M79674 Pain in right toe(s): Secondary | ICD-10-CM | POA: Diagnosis not present

## 2023-07-23 DIAGNOSIS — L110 Acquired keratosis follicularis: Secondary | ICD-10-CM | POA: Diagnosis not present

## 2023-07-23 DIAGNOSIS — M79672 Pain in left foot: Secondary | ICD-10-CM | POA: Diagnosis not present

## 2023-08-13 DIAGNOSIS — E1140 Type 2 diabetes mellitus with diabetic neuropathy, unspecified: Secondary | ICD-10-CM | POA: Diagnosis not present

## 2023-08-13 DIAGNOSIS — M79671 Pain in right foot: Secondary | ICD-10-CM | POA: Diagnosis not present

## 2023-08-13 DIAGNOSIS — M79674 Pain in right toe(s): Secondary | ICD-10-CM | POA: Diagnosis not present

## 2023-08-13 DIAGNOSIS — L110 Acquired keratosis follicularis: Secondary | ICD-10-CM | POA: Diagnosis not present

## 2023-08-13 DIAGNOSIS — L89892 Pressure ulcer of other site, stage 2: Secondary | ICD-10-CM | POA: Diagnosis not present

## 2023-08-15 DIAGNOSIS — I4891 Unspecified atrial fibrillation: Secondary | ICD-10-CM | POA: Diagnosis not present

## 2023-08-15 DIAGNOSIS — Z299 Encounter for prophylactic measures, unspecified: Secondary | ICD-10-CM | POA: Diagnosis not present

## 2023-08-15 DIAGNOSIS — G629 Polyneuropathy, unspecified: Secondary | ICD-10-CM | POA: Diagnosis not present

## 2023-08-15 DIAGNOSIS — R5383 Other fatigue: Secondary | ICD-10-CM | POA: Diagnosis not present

## 2023-08-24 ENCOUNTER — Ambulatory Visit: Payer: Medicare Other | Admitting: Urology

## 2023-09-03 DIAGNOSIS — M79671 Pain in right foot: Secondary | ICD-10-CM | POA: Diagnosis not present

## 2023-09-03 DIAGNOSIS — E1140 Type 2 diabetes mellitus with diabetic neuropathy, unspecified: Secondary | ICD-10-CM | POA: Diagnosis not present

## 2023-09-03 DIAGNOSIS — L110 Acquired keratosis follicularis: Secondary | ICD-10-CM | POA: Diagnosis not present

## 2023-09-03 DIAGNOSIS — M79675 Pain in left toe(s): Secondary | ICD-10-CM | POA: Diagnosis not present

## 2023-09-03 DIAGNOSIS — L89892 Pressure ulcer of other site, stage 2: Secondary | ICD-10-CM | POA: Diagnosis not present

## 2023-09-03 DIAGNOSIS — M79674 Pain in right toe(s): Secondary | ICD-10-CM | POA: Diagnosis not present

## 2023-09-03 DIAGNOSIS — M79672 Pain in left foot: Secondary | ICD-10-CM | POA: Diagnosis not present

## 2023-09-12 DIAGNOSIS — Z299 Encounter for prophylactic measures, unspecified: Secondary | ICD-10-CM | POA: Diagnosis not present

## 2023-09-12 DIAGNOSIS — I10 Essential (primary) hypertension: Secondary | ICD-10-CM | POA: Diagnosis not present

## 2023-09-12 DIAGNOSIS — I4891 Unspecified atrial fibrillation: Secondary | ICD-10-CM | POA: Diagnosis not present

## 2023-09-24 DIAGNOSIS — M79674 Pain in right toe(s): Secondary | ICD-10-CM | POA: Diagnosis not present

## 2023-09-24 DIAGNOSIS — E1140 Type 2 diabetes mellitus with diabetic neuropathy, unspecified: Secondary | ICD-10-CM | POA: Diagnosis not present

## 2023-09-24 DIAGNOSIS — L89892 Pressure ulcer of other site, stage 2: Secondary | ICD-10-CM | POA: Diagnosis not present

## 2023-09-24 DIAGNOSIS — M79671 Pain in right foot: Secondary | ICD-10-CM | POA: Diagnosis not present

## 2023-09-24 DIAGNOSIS — L110 Acquired keratosis follicularis: Secondary | ICD-10-CM | POA: Diagnosis not present

## 2023-10-01 DIAGNOSIS — M79674 Pain in right toe(s): Secondary | ICD-10-CM | POA: Diagnosis not present

## 2023-10-01 DIAGNOSIS — L110 Acquired keratosis follicularis: Secondary | ICD-10-CM | POA: Diagnosis not present

## 2023-10-01 DIAGNOSIS — M79672 Pain in left foot: Secondary | ICD-10-CM | POA: Diagnosis not present

## 2023-10-01 DIAGNOSIS — E1140 Type 2 diabetes mellitus with diabetic neuropathy, unspecified: Secondary | ICD-10-CM | POA: Diagnosis not present

## 2023-10-01 DIAGNOSIS — M79675 Pain in left toe(s): Secondary | ICD-10-CM | POA: Diagnosis not present

## 2023-10-01 DIAGNOSIS — I739 Peripheral vascular disease, unspecified: Secondary | ICD-10-CM | POA: Diagnosis not present

## 2023-10-01 DIAGNOSIS — M79671 Pain in right foot: Secondary | ICD-10-CM | POA: Diagnosis not present

## 2023-10-03 DIAGNOSIS — Z299 Encounter for prophylactic measures, unspecified: Secondary | ICD-10-CM | POA: Diagnosis not present

## 2023-10-03 DIAGNOSIS — I10 Essential (primary) hypertension: Secondary | ICD-10-CM | POA: Diagnosis not present

## 2023-10-03 DIAGNOSIS — I4891 Unspecified atrial fibrillation: Secondary | ICD-10-CM | POA: Diagnosis not present

## 2023-10-18 DIAGNOSIS — D692 Other nonthrombocytopenic purpura: Secondary | ICD-10-CM | POA: Diagnosis not present

## 2023-10-18 DIAGNOSIS — I4891 Unspecified atrial fibrillation: Secondary | ICD-10-CM | POA: Diagnosis not present

## 2023-10-18 DIAGNOSIS — Z299 Encounter for prophylactic measures, unspecified: Secondary | ICD-10-CM | POA: Diagnosis not present

## 2023-10-18 DIAGNOSIS — I10 Essential (primary) hypertension: Secondary | ICD-10-CM | POA: Diagnosis not present

## 2023-10-18 DIAGNOSIS — G629 Polyneuropathy, unspecified: Secondary | ICD-10-CM | POA: Diagnosis not present

## 2023-10-23 DIAGNOSIS — M79672 Pain in left foot: Secondary | ICD-10-CM | POA: Diagnosis not present

## 2023-10-23 DIAGNOSIS — E1140 Type 2 diabetes mellitus with diabetic neuropathy, unspecified: Secondary | ICD-10-CM | POA: Diagnosis not present

## 2023-10-23 DIAGNOSIS — M79674 Pain in right toe(s): Secondary | ICD-10-CM | POA: Diagnosis not present

## 2023-10-23 DIAGNOSIS — M79675 Pain in left toe(s): Secondary | ICD-10-CM | POA: Diagnosis not present

## 2023-10-23 DIAGNOSIS — L89892 Pressure ulcer of other site, stage 2: Secondary | ICD-10-CM | POA: Diagnosis not present

## 2023-10-23 DIAGNOSIS — M79671 Pain in right foot: Secondary | ICD-10-CM | POA: Diagnosis not present

## 2023-10-23 DIAGNOSIS — L110 Acquired keratosis follicularis: Secondary | ICD-10-CM | POA: Diagnosis not present

## 2023-11-02 ENCOUNTER — Ambulatory Visit: Payer: Medicare Other | Admitting: Urology

## 2023-11-02 VITALS — BP 123/73 | HR 74

## 2023-11-02 DIAGNOSIS — Z8546 Personal history of malignant neoplasm of prostate: Secondary | ICD-10-CM | POA: Diagnosis not present

## 2023-11-02 DIAGNOSIS — R32 Unspecified urinary incontinence: Secondary | ICD-10-CM

## 2023-11-02 DIAGNOSIS — N5231 Erectile dysfunction following radical prostatectomy: Secondary | ICD-10-CM

## 2023-11-02 LAB — URINALYSIS, ROUTINE W REFLEX MICROSCOPIC
Bilirubin, UA: NEGATIVE
Glucose, UA: NEGATIVE
Ketones, UA: NEGATIVE
Leukocytes,UA: NEGATIVE
Nitrite, UA: NEGATIVE
Protein,UA: NEGATIVE
RBC, UA: NEGATIVE
Specific Gravity, UA: 1.015 (ref 1.005–1.030)
Urobilinogen, Ur: 0.2 mg/dL (ref 0.2–1.0)
pH, UA: 6.5 (ref 5.0–7.5)

## 2023-11-02 MED ORDER — SOLIFENACIN SUCCINATE 5 MG PO TABS: 5.0000 mg | ORAL_TABLET | Freq: Every day | ORAL | 11 refills | Status: DC

## 2023-11-02 MED ORDER — TADALAFIL 20 MG PO TABS: 20.0000 mg | ORAL_TABLET | ORAL | 5 refills | Status: DC | PRN

## 2023-11-02 NOTE — Progress Notes (Signed)
11/02/2023 1:14 PM   Nicholas Berry 06/03/1941 [id number]  Referring provider: Ignatius Specking, MD [address] [place],  [place] [address]  Prostate cancer   HPI: [name] is a 83yo here for followup for prostate cancer, OAB, and erectile dysfunction. PSA remains undetectable. He has issues with urinary urgency and occasional urge incontinence. He has nocturia 2-3x. He was previously tried on mirabegron and oxybutynin which failed to improve his urgency. He uses a VED for his erectile dysfunction.    PMH: Past Medical History:  Diagnosis Date   Anxiety    Colon polyps    Essential hypertension    GERD (gastroesophageal reflux disease)    History of gout    Hyperlipidemia    Paroxysmal atrial fibrillation (HCC)    Prostate cancer (HCC)    Spinal stenosis    Stroke [facility])    1994    Surgical History: Past Surgical History:  Procedure Laterality Date   BILATERAL DECOMPRESSIVE LAMINECTOMIES & FORAMINOTOMIES L4-L5     BILATERAL TOTAL HIP REPLACEMENTS     CATARACT EXTRACTION W/PHACO Right 08/27/2017   Procedure: CATARACT EXTRACTION PHACO AND INTRAOCULAR LENS PLACEMENT (IOC);  Surgeon: Gemma Payor, MD;  Location: [facility];  Service: Ophthalmology;  Laterality: Right;  CDE: 10.52   CATARACT EXTRACTION W/PHACO Left 10/29/2017   Procedure: CATARACT EXTRACTION PHACO AND INTRAOCULAR LENS PLACEMENT (IOC);  Surgeon: Gemma Payor, MD;  Location: [facility];  Service: Ophthalmology;  Laterality: Left;  CDE: 9.84   COLONOSCOPY     COLONOSCOPY N/A 09/15/2015   Procedure: COLONOSCOPY;  Surgeon: Malissa Hippo, MD;  [facility];  Service: Endoscopy;  Laterality: N/A;  1030   ENDOVENOUS ABLATION SAPHENOUS VEIN W/ LASER Left 01/08/2020   endovenous laser ablation left greater saphenous vein and stab phlebectomy 10-20 incisions left leg by Cari Caraway MD    ENDOVENOUS ABLATION SAPHENOUS VEIN W/ LASER Right 03/04/2020   endovenous laser ablation right greater saphenous vein and stab  phlebectomy> 20 incisions right leg by Cari Caraway MD    PROSTATE SURGERY  2007   cancer-robotic    Home Medications:  Allergies as of 11/02/2023   No Known Allergies      Medication List        Accurate as of November 02, 2023  1:14 PM. If you have any questions, ask your nurse or doctor.          atorvastatin 20 MG tablet Commonly known as: LIPITOR Take 20 mg by mouth at bedtime.   diltiazem 240 MG 24 hr capsule Commonly known as: CARDIZEM CD Take 240 mg by mouth daily.   enalapril 20 MG tablet Commonly known as: VASOTEC Take 20 mg by mouth daily.   Fish Oil 1200 MG Caps Take 1,200 mg by mouth 2 (two) times daily.   multivitamin tablet Take 1 tablet by mouth daily.   omeprazole 20 MG capsule Commonly known as: PRILOSEC Take 20 mg by mouth daily.   pregabalin 150 MG capsule Commonly known as: LYRICA Take 150 mg by mouth 2 (two) times daily.   warfarin 2.5 MG tablet Commonly known as: COUMADIN Take 2.5-5 mg by mouth See admin instructions. Take 2.5 mg by mouth daily in morning.   zolpidem 10 MG tablet Commonly known as: AMBIEN Take 10 mg by mouth at bedtime.        Allergies: No Known Allergies  Family History: Family History  Problem Relation Age of Onset   Heart attack Brother 3  CABG   Heart disease Mother    Heart disease Father     Social History:  reports that he has never smoked. He has never used smokeless tobacco. He reports current alcohol use of about 14.0 standard drinks of alcohol per week. He reports that he does not use drugs.  ROS: All other review of systems were reviewed and are negative except what is noted above in HPI  Physical Exam: BP 123/73   Pulse 74   Constitutional:  Alert and oriented, No acute distress. HEENT: [place] AT, moist mucus membranes.  Trachea midline, no masses. Cardiovascular: No clubbing, cyanosis, or edema. Respiratory: Normal respiratory effort, no increased work of breathing. GI: Abdomen is  soft, nontender, nondistended, no abdominal masses GU: No CVA tenderness.  Lymph: No cervical or inguinal lymphadenopathy. Skin: No rashes, bruises or suspicious lesions. Neurologic: Grossly intact, no focal deficits, moving all 4 extremities. Psychiatric: Normal mood and affect.  Laboratory Data: Lab Results  Component Value Date   WBC 8.2 08/20/2017   HGB 16.7 08/20/2017   HCT 48.3 08/20/2017   MCV 87.3 08/20/2017   PLT 160 08/20/2017    Lab Results  Component Value Date   CREATININE 0.81 08/20/2017    No results found for: "PSA"  No results found for: "TESTOSTERONE"  No results found for: "HGBA1C"  Urinalysis    Component Value Date/Time   APPEARANCEUR Clear 06/22/2022 1025   GLUCOSEU Negative 06/22/2022 1025   BILIRUBINUR Negative 06/22/2022 1025   PROTEINUR Negative 06/22/2022 1025   NITRITE Negative 06/22/2022 1025   LEUKOCYTESUR Trace (A) 06/22/2022 1025    Lab Results  Component Value Date   LABMICR See below: 06/22/2022   WBCUA 6-10 (A) 06/22/2022   LABEPIT 0-10 06/22/2022   BACTERIA Few 06/22/2022    Pertinent Imaging:  No results found for this or any previous visit.  No results found for this or any previous visit.  No results found for this or any previous visit.  No results found for this or any previous visit.  No results found for this or any previous visit.  No results found for this or any previous visit.  No results found for this or any previous visit.  No results found for this or any previous visit.   Assessment & Plan:    1. History of prostate cancer (Primary) Followup 6 months with a PSA - Urinalysis, Routine w reflex microscopic  2. Urinary incontinence, unspecified type Vesicare 5mg  daily  3. Erectile dysfunction after radical prostatectomy We will trial tadalafil 20mg  prn with his VED.    No follow-ups on file.  Wilkie Aye, MD  [facility] [place]

## 2023-11-07 DIAGNOSIS — J069 Acute upper respiratory infection, unspecified: Secondary | ICD-10-CM | POA: Diagnosis not present

## 2023-11-07 DIAGNOSIS — L97519 Non-pressure chronic ulcer of other part of right foot with unspecified severity: Secondary | ICD-10-CM | POA: Diagnosis not present

## 2023-11-07 DIAGNOSIS — Z299 Encounter for prophylactic measures, unspecified: Secondary | ICD-10-CM | POA: Diagnosis not present

## 2023-11-07 DIAGNOSIS — I10 Essential (primary) hypertension: Secondary | ICD-10-CM | POA: Diagnosis not present

## 2023-11-07 DIAGNOSIS — I4891 Unspecified atrial fibrillation: Secondary | ICD-10-CM | POA: Diagnosis not present

## 2023-11-07 DIAGNOSIS — M79671 Pain in right foot: Secondary | ICD-10-CM | POA: Diagnosis not present

## 2023-11-12 ENCOUNTER — Encounter: Payer: Self-pay | Admitting: Urology

## 2023-11-12 NOTE — Patient Instructions (Signed)

## 2023-11-13 DIAGNOSIS — L89892 Pressure ulcer of other site, stage 2: Secondary | ICD-10-CM | POA: Diagnosis not present

## 2023-11-13 DIAGNOSIS — M79675 Pain in left toe(s): Secondary | ICD-10-CM | POA: Diagnosis not present

## 2023-11-13 DIAGNOSIS — M79672 Pain in left foot: Secondary | ICD-10-CM | POA: Diagnosis not present

## 2023-11-13 DIAGNOSIS — L110 Acquired keratosis follicularis: Secondary | ICD-10-CM | POA: Diagnosis not present

## 2023-11-13 DIAGNOSIS — E1140 Type 2 diabetes mellitus with diabetic neuropathy, unspecified: Secondary | ICD-10-CM | POA: Diagnosis not present

## 2023-11-19 DIAGNOSIS — L97519 Non-pressure chronic ulcer of other part of right foot with unspecified severity: Secondary | ICD-10-CM | POA: Diagnosis not present

## 2023-11-28 DIAGNOSIS — I4891 Unspecified atrial fibrillation: Secondary | ICD-10-CM | POA: Diagnosis not present

## 2023-11-28 DIAGNOSIS — Z299 Encounter for prophylactic measures, unspecified: Secondary | ICD-10-CM | POA: Diagnosis not present

## 2023-11-28 DIAGNOSIS — I10 Essential (primary) hypertension: Secondary | ICD-10-CM | POA: Diagnosis not present

## 2023-12-04 DIAGNOSIS — M79671 Pain in right foot: Secondary | ICD-10-CM | POA: Diagnosis not present

## 2023-12-04 DIAGNOSIS — E11628 Type 2 diabetes mellitus with other skin complications: Secondary | ICD-10-CM | POA: Diagnosis not present

## 2023-12-04 DIAGNOSIS — M10472 Other secondary gout, left ankle and foot: Secondary | ICD-10-CM | POA: Diagnosis not present

## 2023-12-04 DIAGNOSIS — L89892 Pressure ulcer of other site, stage 2: Secondary | ICD-10-CM | POA: Diagnosis not present

## 2023-12-04 DIAGNOSIS — M79674 Pain in right toe(s): Secondary | ICD-10-CM | POA: Diagnosis not present

## 2023-12-04 DIAGNOSIS — E1140 Type 2 diabetes mellitus with diabetic neuropathy, unspecified: Secondary | ICD-10-CM | POA: Diagnosis not present

## 2023-12-04 DIAGNOSIS — R799 Abnormal finding of blood chemistry, unspecified: Secondary | ICD-10-CM | POA: Diagnosis not present

## 2023-12-04 DIAGNOSIS — L089 Local infection of the skin and subcutaneous tissue, unspecified: Secondary | ICD-10-CM | POA: Diagnosis not present

## 2023-12-04 DIAGNOSIS — L110 Acquired keratosis follicularis: Secondary | ICD-10-CM | POA: Diagnosis not present

## 2023-12-10 DIAGNOSIS — L110 Acquired keratosis follicularis: Secondary | ICD-10-CM | POA: Diagnosis not present

## 2023-12-10 DIAGNOSIS — M79672 Pain in left foot: Secondary | ICD-10-CM | POA: Diagnosis not present

## 2023-12-10 DIAGNOSIS — M79671 Pain in right foot: Secondary | ICD-10-CM | POA: Diagnosis not present

## 2023-12-10 DIAGNOSIS — M79675 Pain in left toe(s): Secondary | ICD-10-CM | POA: Diagnosis not present

## 2023-12-10 DIAGNOSIS — I739 Peripheral vascular disease, unspecified: Secondary | ICD-10-CM | POA: Diagnosis not present

## 2023-12-10 DIAGNOSIS — M79674 Pain in right toe(s): Secondary | ICD-10-CM | POA: Diagnosis not present

## 2023-12-10 DIAGNOSIS — E1140 Type 2 diabetes mellitus with diabetic neuropathy, unspecified: Secondary | ICD-10-CM | POA: Diagnosis not present

## 2023-12-24 DIAGNOSIS — L89892 Pressure ulcer of other site, stage 2: Secondary | ICD-10-CM | POA: Diagnosis not present

## 2023-12-24 DIAGNOSIS — M79671 Pain in right foot: Secondary | ICD-10-CM | POA: Diagnosis not present

## 2023-12-24 DIAGNOSIS — M79674 Pain in right toe(s): Secondary | ICD-10-CM | POA: Diagnosis not present

## 2023-12-24 DIAGNOSIS — E1140 Type 2 diabetes mellitus with diabetic neuropathy, unspecified: Secondary | ICD-10-CM | POA: Diagnosis not present

## 2023-12-24 DIAGNOSIS — L110 Acquired keratosis follicularis: Secondary | ICD-10-CM | POA: Diagnosis not present

## 2023-12-26 DIAGNOSIS — I4891 Unspecified atrial fibrillation: Secondary | ICD-10-CM | POA: Diagnosis not present

## 2023-12-26 DIAGNOSIS — Z299 Encounter for prophylactic measures, unspecified: Secondary | ICD-10-CM | POA: Diagnosis not present

## 2023-12-26 DIAGNOSIS — I10 Essential (primary) hypertension: Secondary | ICD-10-CM | POA: Diagnosis not present

## 2023-12-26 DIAGNOSIS — I739 Peripheral vascular disease, unspecified: Secondary | ICD-10-CM | POA: Diagnosis not present

## 2023-12-26 DIAGNOSIS — K5900 Constipation, unspecified: Secondary | ICD-10-CM | POA: Diagnosis not present

## 2024-01-15 DIAGNOSIS — L89892 Pressure ulcer of other site, stage 2: Secondary | ICD-10-CM | POA: Diagnosis not present

## 2024-01-15 DIAGNOSIS — M79675 Pain in left toe(s): Secondary | ICD-10-CM | POA: Diagnosis not present

## 2024-01-15 DIAGNOSIS — M79672 Pain in left foot: Secondary | ICD-10-CM | POA: Diagnosis not present

## 2024-01-15 DIAGNOSIS — E1140 Type 2 diabetes mellitus with diabetic neuropathy, unspecified: Secondary | ICD-10-CM | POA: Diagnosis not present

## 2024-01-15 DIAGNOSIS — L110 Acquired keratosis follicularis: Secondary | ICD-10-CM | POA: Diagnosis not present

## 2024-01-23 DIAGNOSIS — I4891 Unspecified atrial fibrillation: Secondary | ICD-10-CM | POA: Diagnosis not present

## 2024-01-23 DIAGNOSIS — I10 Essential (primary) hypertension: Secondary | ICD-10-CM | POA: Diagnosis not present

## 2024-01-23 DIAGNOSIS — R233 Spontaneous ecchymoses: Secondary | ICD-10-CM | POA: Diagnosis not present

## 2024-01-23 DIAGNOSIS — Z299 Encounter for prophylactic measures, unspecified: Secondary | ICD-10-CM | POA: Diagnosis not present

## 2024-02-06 ENCOUNTER — Ambulatory Visit: Payer: Medicare Other | Admitting: Urology

## 2024-02-06 ENCOUNTER — Ambulatory Visit: Admitting: Urology

## 2024-02-06 VITALS — BP 150/84 | HR 73

## 2024-02-06 DIAGNOSIS — R32 Unspecified urinary incontinence: Secondary | ICD-10-CM | POA: Diagnosis not present

## 2024-02-06 DIAGNOSIS — N5231 Erectile dysfunction following radical prostatectomy: Secondary | ICD-10-CM

## 2024-02-06 LAB — URINALYSIS, ROUTINE W REFLEX MICROSCOPIC
Bilirubin, UA: NEGATIVE
Glucose, UA: NEGATIVE
Ketones, UA: NEGATIVE
Leukocytes,UA: NEGATIVE
Nitrite, UA: NEGATIVE
Protein,UA: NEGATIVE
RBC, UA: NEGATIVE
Specific Gravity, UA: 1.015 (ref 1.005–1.030)
Urobilinogen, Ur: 0.2 mg/dL (ref 0.2–1.0)
pH, UA: 6.5 (ref 5.0–7.5)

## 2024-02-06 MED ORDER — TADALAFIL 20 MG PO TABS: 20.0000 mg | ORAL_TABLET | ORAL | 5 refills | Status: DC | PRN

## 2024-02-06 NOTE — Progress Notes (Signed)
 02/06/2024 8:54 AM   Nicholas Berry 1941/02/12 [id number]  Referring provider: Orlena Bitters, MD [address] [place],  [place] [address]  Urinary incontinence   HPI: [name] is a 83yo here for followup for urinary incontinence and erectile dysfunction. He was started on vesicare  5mg  daily and noted improvement in his daytime incontinence. He has nocturnal enuresis 1x. He takes vesicare  at night. He uses tadalafil  20mg  and a VED with good results   PMH: Past Medical History:  Diagnosis Date   Anxiety    Colon polyps    Essential hypertension    GERD (gastroesophageal reflux disease)    History of gout    Hyperlipidemia    Paroxysmal atrial fibrillation (HCC)    Prostate cancer (HCC)    Spinal stenosis    Stroke [facility])    1994    Surgical History: Past Surgical History:  Procedure Laterality Date   BILATERAL DECOMPRESSIVE LAMINECTOMIES & FORAMINOTOMIES L4-L5     BILATERAL TOTAL HIP REPLACEMENTS     CATARACT EXTRACTION W/PHACO Right 08/27/2017   Procedure: CATARACT EXTRACTION PHACO AND INTRAOCULAR LENS PLACEMENT (IOC);  Surgeon: Anner Kill, MD;  Location: [facility];  Service: Ophthalmology;  Laterality: Right;  CDE: 10.52   CATARACT EXTRACTION W/PHACO Left 10/29/2017   Procedure: CATARACT EXTRACTION PHACO AND INTRAOCULAR LENS PLACEMENT (IOC);  Surgeon: Anner Kill, MD;  Location: [facility]S;  Service: Ophthalmology;  Laterality: Left;  CDE: 9.84   COLONOSCOPY     COLONOSCOPY N/A 09/15/2015   Procedure: COLONOSCOPY;  Surgeon: Ruby Corporal, MD;  Location: [facility];  Service: Endoscopy;  Laterality: N/A;  1030   ENDOVENOUS ABLATION SAPHENOUS VEIN W/ LASER Left 01/08/2020   endovenous laser ablation left greater saphenous vein and stab phlebectomy 10-20 incisions left leg by Kirtland Perfect MD    ENDOVENOUS ABLATION SAPHENOUS VEIN W/ LASER Right 03/04/2020   endovenous laser ablation right greater saphenous vein and stab phlebectomy> 20 incisions right leg by Kirtland Perfect MD     PROSTATE SURGERY  2007   cancer-robotic    Home Medications:  Allergies as of 02/06/2024   No Known Allergies      Medication List        Accurate as of February 06, 2024  8:54 AM. If you have any questions, ask your nurse or doctor.          atorvastatin  20 MG tablet Commonly known as: LIPITOR Take 20 mg by mouth at bedtime.   diltiazem 240 MG 24 hr capsule Commonly known as: CARDIZEM CD Take 240 mg by mouth daily.   enalapril 20 MG tablet Commonly known as: VASOTEC Take 20 mg by mouth daily.   Fish Oil 1200 MG Caps Take 1,200 mg by mouth 2 (two) times daily.   multivitamin tablet Take 1 tablet by mouth daily.   omeprazole 20 MG capsule Commonly known as: PRILOSEC Take 20 mg by mouth daily.   pregabalin 150 MG capsule Commonly known as: LYRICA Take 150 mg by mouth 2 (two) times daily.   solifenacin  5 MG tablet Commonly known as: VESICARE  Take 1 tablet (5 mg total) by mouth daily.   tadalafil  20 MG tablet Commonly known as: CIALIS  Take 1 tablet (20 mg total) by mouth as needed.   warfarin 2.5 MG tablet Commonly known as: COUMADIN Take 2.5-5 mg by mouth See admin instructions. Take 2.5 mg by mouth daily in morning.   zolpidem 10 MG tablet Commonly known as: AMBIEN Take 10 mg by mouth  at bedtime.        Allergies: No Known Allergies  Family History: Family History  Problem Relation Age of Onset   Heart attack Brother 51       CABG   Heart disease Mother    Heart disease Father     Social History:  reports that he has never smoked. He has never used smokeless tobacco. He reports current alcohol use of about 14.0 standard drinks of alcohol per week. He reports that he does not use drugs.  ROS: All other review of systems were reviewed and are negative except what is noted above in HPI  Physical Exam: BP (!) 150/84   Pulse 73   Constitutional:  Alert and oriented, No acute distress. HEENT: [place] AT, moist mucus membranes.  Trachea midline,  no masses. Cardiovascular: No clubbing, cyanosis, or edema. Respiratory: Normal respiratory effort, no increased work of breathing. GI: Abdomen is soft, nontender, nondistended, no abdominal masses GU: No CVA tenderness.  Lymph: No cervical or inguinal lymphadenopathy. Skin: No rashes, bruises or suspicious lesions. Neurologic: Grossly intact, no focal deficits, moving all 4 extremities. Psychiatric: Normal mood and affect.  Laboratory Data: Lab Results  Component Value Date   WBC 8.2 08/20/2017   HGB 16.7 08/20/2017   HCT 48.3 08/20/2017   MCV 87.3 08/20/2017   PLT 160 08/20/2017    Lab Results  Component Value Date   CREATININE 0.81 08/20/2017    No results found for: "PSA"  No results found for: "TESTOSTERONE"  No results found for: "HGBA1C"  Urinalysis    Component Value Date/Time   APPEARANCEUR Clear 11/02/2023 1250   GLUCOSEU Negative 11/02/2023 1250   BILIRUBINUR Negative 11/02/2023 1250   PROTEINUR Negative 11/02/2023 1250   NITRITE Negative 11/02/2023 1250   LEUKOCYTESUR Negative 11/02/2023 1250    Lab Results  Component Value Date   LABMICR Comment 11/02/2023   WBCUA 6-10 (A) 06/22/2022   LABEPIT 0-10 06/22/2022   BACTERIA Few 06/22/2022    Pertinent Imaging:  No results found for this or any previous visit.  No results found for this or any previous visit.  No results found for this or any previous visit.  No results found for this or any previous visit.  No results found for this or any previous visit.  No results found for this or any previous visit.  No results found for this or any previous visit.  No results found for this or any previous visit.   Assessment & Plan:    1. Urinary incontinence, unspecified type (Primary) Switch vesicare  to 5mg  in AM - Urinalysis, Routine w reflex microscopic  2. Erectile dysfunction after radical prostatectomy -continue tadalafil  20mg     No follow-ups on file.  Nicholas Nailer,  MD  [facility] [place]

## 2024-02-11 DIAGNOSIS — M79672 Pain in left foot: Secondary | ICD-10-CM | POA: Diagnosis not present

## 2024-02-11 DIAGNOSIS — E1140 Type 2 diabetes mellitus with diabetic neuropathy, unspecified: Secondary | ICD-10-CM | POA: Diagnosis not present

## 2024-02-11 DIAGNOSIS — M79675 Pain in left toe(s): Secondary | ICD-10-CM | POA: Diagnosis not present

## 2024-02-11 DIAGNOSIS — L89892 Pressure ulcer of other site, stage 2: Secondary | ICD-10-CM | POA: Diagnosis not present

## 2024-02-11 DIAGNOSIS — L110 Acquired keratosis follicularis: Secondary | ICD-10-CM | POA: Diagnosis not present

## 2024-02-12 ENCOUNTER — Encounter: Payer: Self-pay | Admitting: Urology

## 2024-02-12 NOTE — Patient Instructions (Signed)

## 2024-02-18 DIAGNOSIS — Z0000 Encounter for general adult medical examination without abnormal findings: Secondary | ICD-10-CM | POA: Diagnosis not present

## 2024-02-18 DIAGNOSIS — Z299 Encounter for prophylactic measures, unspecified: Secondary | ICD-10-CM | POA: Diagnosis not present

## 2024-02-18 DIAGNOSIS — D6869 Other thrombophilia: Secondary | ICD-10-CM | POA: Diagnosis not present

## 2024-02-18 DIAGNOSIS — I4891 Unspecified atrial fibrillation: Secondary | ICD-10-CM | POA: Diagnosis not present

## 2024-02-18 DIAGNOSIS — I10 Essential (primary) hypertension: Secondary | ICD-10-CM | POA: Diagnosis not present

## 2024-02-18 DIAGNOSIS — I739 Peripheral vascular disease, unspecified: Secondary | ICD-10-CM | POA: Diagnosis not present

## 2024-02-18 DIAGNOSIS — Z7189 Other specified counseling: Secondary | ICD-10-CM | POA: Diagnosis not present

## 2024-02-20 DIAGNOSIS — Z299 Encounter for prophylactic measures, unspecified: Secondary | ICD-10-CM | POA: Diagnosis not present

## 2024-02-20 DIAGNOSIS — D692 Other nonthrombocytopenic purpura: Secondary | ICD-10-CM | POA: Diagnosis not present

## 2024-02-20 DIAGNOSIS — I10 Essential (primary) hypertension: Secondary | ICD-10-CM | POA: Diagnosis not present

## 2024-02-20 DIAGNOSIS — I4891 Unspecified atrial fibrillation: Secondary | ICD-10-CM | POA: Diagnosis not present

## 2024-02-22 DIAGNOSIS — Z299 Encounter for prophylactic measures, unspecified: Secondary | ICD-10-CM | POA: Diagnosis not present

## 2024-02-22 DIAGNOSIS — L989 Disorder of the skin and subcutaneous tissue, unspecified: Secondary | ICD-10-CM | POA: Diagnosis not present

## 2024-02-22 DIAGNOSIS — K219 Gastro-esophageal reflux disease without esophagitis: Secondary | ICD-10-CM | POA: Diagnosis not present

## 2024-02-22 DIAGNOSIS — H05012 Cellulitis of left orbit: Secondary | ICD-10-CM | POA: Diagnosis not present

## 2024-02-26 DIAGNOSIS — I10 Essential (primary) hypertension: Secondary | ICD-10-CM | POA: Diagnosis not present

## 2024-02-26 DIAGNOSIS — I739 Peripheral vascular disease, unspecified: Secondary | ICD-10-CM | POA: Diagnosis not present

## 2024-02-26 DIAGNOSIS — H05012 Cellulitis of left orbit: Secondary | ICD-10-CM | POA: Diagnosis not present

## 2024-02-26 DIAGNOSIS — Z299 Encounter for prophylactic measures, unspecified: Secondary | ICD-10-CM | POA: Diagnosis not present

## 2024-02-26 DIAGNOSIS — I4891 Unspecified atrial fibrillation: Secondary | ICD-10-CM | POA: Diagnosis not present

## 2024-03-03 DIAGNOSIS — M79674 Pain in right toe(s): Secondary | ICD-10-CM | POA: Diagnosis not present

## 2024-03-03 DIAGNOSIS — I739 Peripheral vascular disease, unspecified: Secondary | ICD-10-CM | POA: Diagnosis not present

## 2024-03-03 DIAGNOSIS — M79671 Pain in right foot: Secondary | ICD-10-CM | POA: Diagnosis not present

## 2024-03-03 DIAGNOSIS — E1140 Type 2 diabetes mellitus with diabetic neuropathy, unspecified: Secondary | ICD-10-CM | POA: Diagnosis not present

## 2024-03-03 DIAGNOSIS — M79675 Pain in left toe(s): Secondary | ICD-10-CM | POA: Diagnosis not present

## 2024-03-03 DIAGNOSIS — M79672 Pain in left foot: Secondary | ICD-10-CM | POA: Diagnosis not present

## 2024-03-03 DIAGNOSIS — L110 Acquired keratosis follicularis: Secondary | ICD-10-CM | POA: Diagnosis not present

## 2024-03-19 DIAGNOSIS — I4891 Unspecified atrial fibrillation: Secondary | ICD-10-CM | POA: Diagnosis not present

## 2024-03-19 DIAGNOSIS — Z299 Encounter for prophylactic measures, unspecified: Secondary | ICD-10-CM | POA: Diagnosis not present

## 2024-03-19 DIAGNOSIS — D692 Other nonthrombocytopenic purpura: Secondary | ICD-10-CM | POA: Diagnosis not present

## 2024-03-19 DIAGNOSIS — I10 Essential (primary) hypertension: Secondary | ICD-10-CM | POA: Diagnosis not present

## 2024-03-22 ENCOUNTER — Other Ambulatory Visit: Payer: Self-pay

## 2024-03-22 ENCOUNTER — Emergency Department (HOSPITAL_COMMUNITY)

## 2024-03-22 ENCOUNTER — Emergency Department (HOSPITAL_COMMUNITY)
Admission: EM | Admit: 2024-03-22 | Discharge: 2024-03-22 | Disposition: A | Discharge status: Home / Self Care | Attending: Emergency Medicine | Admitting: Emergency Medicine

## 2024-03-22 ENCOUNTER — Encounter (HOSPITAL_COMMUNITY): Payer: Self-pay

## 2024-03-22 DIAGNOSIS — S199XXA Unspecified injury of neck, initial encounter: Secondary | ICD-10-CM | POA: Diagnosis not present

## 2024-03-22 DIAGNOSIS — I483 Typical atrial flutter: Secondary | ICD-10-CM | POA: Insufficient documentation

## 2024-03-22 DIAGNOSIS — R569 Unspecified convulsions: Secondary | ICD-10-CM | POA: Diagnosis not present

## 2024-03-22 DIAGNOSIS — Z8673 Personal history of transient ischemic attack (TIA), and cerebral infarction without residual deficits: Secondary | ICD-10-CM | POA: Insufficient documentation

## 2024-03-22 DIAGNOSIS — R000 Tachycardia, unspecified: Secondary | ICD-10-CM | POA: Diagnosis present

## 2024-03-22 DIAGNOSIS — Z7901 Long term (current) use of anticoagulants: Secondary | ICD-10-CM | POA: Insufficient documentation

## 2024-03-22 DIAGNOSIS — R270 Ataxia, unspecified: Secondary | ICD-10-CM | POA: Diagnosis not present

## 2024-03-22 DIAGNOSIS — I10 Essential (primary) hypertension: Secondary | ICD-10-CM | POA: Diagnosis not present

## 2024-03-22 DIAGNOSIS — I4892 Unspecified atrial flutter: Secondary | ICD-10-CM | POA: Diagnosis not present

## 2024-03-22 DIAGNOSIS — S0990XA Unspecified injury of head, initial encounter: Secondary | ICD-10-CM | POA: Diagnosis not present

## 2024-03-22 DIAGNOSIS — R251 Tremor, unspecified: Secondary | ICD-10-CM | POA: Insufficient documentation

## 2024-03-22 DIAGNOSIS — D72829 Elevated white blood cell count, unspecified: Secondary | ICD-10-CM | POA: Diagnosis not present

## 2024-03-22 DIAGNOSIS — I700 Atherosclerosis of aorta: Secondary | ICD-10-CM | POA: Diagnosis not present

## 2024-03-22 DIAGNOSIS — W01198A Fall on same level from slipping, tripping and stumbling with subsequent striking against other object, initial encounter: Secondary | ICD-10-CM | POA: Insufficient documentation

## 2024-03-22 LAB — CBC WITH DIFFERENTIAL/PLATELET
Abs Immature Granulocytes: 0.04 10*3/uL (ref 0.00–0.07)
Basophils Absolute: 0 10*3/uL (ref 0.0–0.1)
Basophils Relative: 0 %
Eosinophils Absolute: 0 10*3/uL (ref 0.0–0.5)
Eosinophils Relative: 0 %
HCT: 48.3 % (ref 39.0–52.0)
Hemoglobin: 16 g/dL (ref 13.0–17.0)
Immature Granulocytes: 0 %
Lymphocytes Relative: 8 %
Lymphs Abs: 0.9 10*3/uL (ref 0.7–4.0)
MCH: 28.3 pg (ref 26.0–34.0)
MCHC: 33.1 g/dL (ref 30.0–36.0)
MCV: 85.3 fL (ref 80.0–100.0)
Monocytes Absolute: 0.6 10*3/uL (ref 0.1–1.0)
Monocytes Relative: 5 %
Neutro Abs: 9.8 10*3/uL — ABNORMAL HIGH (ref 1.7–7.7)
Neutrophils Relative %: 87 %
Platelets: 136 10*3/uL — ABNORMAL LOW (ref 150–400)
RBC: 5.66 MIL/uL (ref 4.22–5.81)
RDW: 13.2 % (ref 11.5–15.5)
WBC: 11.4 10*3/uL — ABNORMAL HIGH (ref 4.0–10.5)
nRBC: 0 % (ref 0.0–0.2)

## 2024-03-22 LAB — URINALYSIS, ROUTINE W REFLEX MICROSCOPIC
Bilirubin Urine: NEGATIVE
Glucose, UA: NEGATIVE mg/dL
Hgb urine dipstick: NEGATIVE
Ketones, ur: NEGATIVE mg/dL
Leukocytes,Ua: NEGATIVE
Nitrite: NEGATIVE
Protein, ur: NEGATIVE mg/dL
Specific Gravity, Urine: 1.011 (ref 1.005–1.030)
pH: 7 (ref 5.0–8.0)

## 2024-03-22 LAB — TSH: TSH: 1.026 u[IU]/mL (ref 0.350–4.500)

## 2024-03-22 LAB — COMPREHENSIVE METABOLIC PANEL WITH GFR
ALT: 19 U/L (ref 0–44)
AST: 21 U/L (ref 15–41)
Albumin: 4 g/dL (ref 3.5–5.0)
Alkaline Phosphatase: 84 U/L (ref 38–126)
Anion gap: 11 (ref 5–15)
BUN: 17 mg/dL (ref 8–23)
CO2: 24 mmol/L (ref 22–32)
Calcium: 9 mg/dL (ref 8.9–10.3)
Chloride: 104 mmol/L (ref 98–111)
Creatinine, Ser: 0.94 mg/dL (ref 0.61–1.24)
GFR, Estimated: >60 mL/min (ref >60)
Glucose, Bld: 125 mg/dL — ABNORMAL HIGH (ref 70–99)
Potassium: 4.6 mmol/L (ref 3.5–5.1)
Sodium: 139 mmol/L (ref 135–145)
Total Bilirubin: 2.2 mg/dL — ABNORMAL HIGH (ref 0.0–1.2)
Total Protein: 7 g/dL (ref 6.5–8.1)

## 2024-03-22 LAB — MAGNESIUM: Magnesium: 1.7 mg/dL (ref 1.7–2.4)

## 2024-03-22 LAB — TROPONIN I (HIGH SENSITIVITY)
Troponin I (High Sensitivity): 6 ng/L (ref <18)
Troponin I (High Sensitivity): 11 ng/L (ref <18)

## 2024-03-22 LAB — PROTIME-INR
INR: 2.3 — ABNORMAL HIGH (ref 0.8–1.2)
Prothrombin Time: 25.7 s — ABNORMAL HIGH (ref 11.4–15.2)

## 2024-03-22 MED ORDER — SODIUM CHLORIDE 0.9 % IV BOLUS
500.0000 mL | Freq: Once | INTRAVENOUS | Status: AC
  Administered 2024-03-22: 500 mL via INTRAVENOUS

## 2024-03-22 MED ORDER — DILTIAZEM LOAD VIA INFUSION
20.0000 mg | Freq: Once | INTRAVENOUS | Status: AC
  Administered 2024-03-22: 20 mg via INTRAVENOUS
  Filled 2024-03-22: qty 20

## 2024-03-22 MED ORDER — DILTIAZEM HCL-DEXTROSE 125-5 MG/125ML-% IV SOLN (PREMIX)
5.0000 mg/h | INTRAVENOUS | Status: DC
  Administered 2024-03-22: 5 mg/h via INTRAVENOUS
  Filled 2024-03-22: qty 125

## 2024-03-22 NOTE — ED Triage Notes (Addendum)
 Pt from home complains of tremors after fall. Pt fell and hit head yesterday states he stripped over a brick, but states tremors started an hour PTA. Denies dizziness, SOB, CP, and visual changes. Pt AAOx4 and ambulatory. Takes Blood thinner, no hematomas/bruises noted. Hx of stroke.

## 2024-03-22 NOTE — ED Notes (Signed)
 Swelling has decreased an significant amount in left upper extremity, patient still reports no pain, patient now discharged.

## 2024-03-22 NOTE — Discharge Instructions (Signed)
 Please follow-up closely with your primary care doctor and with cardiology on an outpatient basis.  Return to emergency department immediately for any new or worsening symptoms.

## 2024-03-22 NOTE — ED Notes (Addendum)
 Patient has discharge orders and upon removing patients IV this nurse noted it was swelling around the site and pink in appearance, patient reports no pain. Patient had Cardizem going at this time 5mg /hr. PA made aware and pharmacist Samuel Crock made aware. Recommendations from pharmacy are to elevated extremity and apply warm compress at home. I made patient aware of this and he was comfortable with staying a while to be sure swelling decreases and no pain arises. PA and pharmacist both made aware of patient staying for observation.

## 2024-03-22 NOTE — ED Provider Notes (Signed)
 [place] EMERGENCY DEPARTMENT AT [facility] Provider Note   CSN: [id number] Arrival date & time: 03/22/24  0957     History  Chief Complaint  Patient presents with   Fall   Tremors    Nicholas Berry is a 83 y.o. male.  Patient is an 83 year old male who presents to the emergency department the chief complaint of tremors which have been ongoing for approximately 1 hour prior to arrival.  Patient notes that he did have a fall approximately 3 days ago and did strike his head.  Patient denies any associated chest pain, shortness of breath, palpitations, abdominal pain, nausea, vomiting, diarrhea.  He denies any active headache, pain to neck or back.  He denies any associated numbness, paresthesias or unilateral weakness.  He denies any active dizziness or lightheadedness.  There is been no associated syncope.  He denies any medication changes.  He has continued to eat as a baseline.   Fall       Home Medications Prior to Admission medications   Medication Sig Start Date End Date Taking? Authorizing Provider  atorvastatin  (LIPITOR) 20 MG tablet Take 20 mg by mouth at bedtime.  08/05/15   [provider]  diltiazem (CARDIZEM CD) 240 MG 24 hr capsule Take 240 mg by mouth daily.      [provider]  enalapril (VASOTEC) 20 MG tablet Take 20 mg by mouth daily.     [provider]  Multiple Vitamin (MULTIVITAMIN) tablet Take 1 tablet by mouth daily.    [provider]  Omega-3 Fatty Acids (FISH OIL) 1200 MG CAPS Take 1,200 mg by mouth 2 (two) times daily.     [provider]  omeprazole (PRILOSEC) 20 MG capsule Take 20 mg by mouth daily. 07/07/21   [provider]  pregabalin (LYRICA) 150 MG capsule Take 150 mg by mouth 2 (two) times daily.    [provider]  solifenacin  (VESICARE ) 5 MG tablet Take 1 tablet (5 mg total) by mouth daily. 11/02/23   McKenzie, Arden Beck, MD  tadalafil  (CIALIS ) 20 MG tablet Take 1  tablet (20 mg total) by mouth as needed. 02/06/24   McKenzie, Arden Beck, MD  warfarin (COUMADIN) 2.5 MG tablet Take 2.5-5 mg by mouth See admin instructions. Take 2.5 mg by mouth daily in morning.    [provider]  zolpidem (AMBIEN) 10 MG tablet Take 10 mg by mouth at bedtime.     [provider]      Allergies    Patient has no known allergies.    Review of Systems   Review of Systems  Neurological:  Positive for tremors.  All other systems reviewed and are negative.   Physical Exam Updated Vital Signs BP (!) 154/90   Pulse (!) 101   Temp 98.7 F (37.1 C) (Oral)   Resp 11   Ht 5\' 11"  (1.803 m)   Wt 87.5 kg   SpO2 95%   BMI 26.92 kg/m  Physical Exam Vitals and nursing note reviewed.  Constitutional:      Appearance: Normal appearance.  HENT:     Head: Normocephalic and atraumatic.     Nose: Nose normal.     Mouth/Throat:     Mouth: Mucous membranes are moist.  Eyes:     Extraocular Movements: Extraocular movements intact.     Conjunctiva/sclera: Conjunctivae normal.     Pupils: Pupils are equal, round, and reactive to light.  Cardiovascular:     Rate  and Rhythm: Regular rhythm. Tachycardia present.     Pulses: Normal pulses.     Heart sounds: Normal heart sounds. No murmur heard. Pulmonary:     Effort: Pulmonary effort is normal. No respiratory distress.     Breath sounds: Normal breath sounds. No stridor. No wheezing, rhonchi or rales.  Abdominal:     General: Abdomen is flat. Bowel sounds are normal. There is no distension.     Palpations: Abdomen is soft.     Tenderness: There is no abdominal tenderness. There is no guarding.  Musculoskeletal:        General: Normal range of motion.     Cervical back: Normal range of motion and neck supple. No rigidity or tenderness.     Right lower leg: No edema.     Left lower leg: No edema.  Skin:    General: Skin is warm and dry.     Coloration: Skin is not jaundiced.     Findings: No bruising or  rash.  Neurological:     General: No focal deficit present.     Mental Status: He is alert and oriented to person, place, and time. Mental status is at baseline.     Cranial Nerves: No cranial nerve deficit.     Sensory: No sensory deficit.     Motor: No weakness.     Coordination: Coordination normal.     Gait: Gait normal.     Comments: Tremor noted, finger-nose intact, normal rapid altering movements, normal heel-to-shin  Psychiatric:        Mood and Affect: Mood normal.        Behavior: Behavior normal.        Thought Content: Thought content normal.        Judgment: Judgment normal.     ED Results / Procedures / Treatments   Labs (all labs ordered are listed, but only abnormal results are displayed) Labs Reviewed  CBC WITH DIFFERENTIAL/PLATELET - Abnormal; Notable for the following components:      Result Value   WBC 11.4 (*)    Platelets 136 (*)    Neutro Abs 9.8 (*)    All other components within normal limits  COMPREHENSIVE METABOLIC PANEL WITH GFR - Abnormal; Notable for the following components:   Glucose, Bld 125 (*)    Total Bilirubin 2.2 (*)    All other components within normal limits  PROTIME-INR - Abnormal; Notable for the following components:   Prothrombin Time 25.7 (*)    INR 2.3 (*)    All other components within normal limits  MAGNESIUM  TSH  URINALYSIS, ROUTINE W REFLEX MICROSCOPIC  TROPONIN I (HIGH SENSITIVITY)  TROPONIN I (HIGH SENSITIVITY)    EKG EKG Interpretation Date/Time:  Saturday March 22 2024 10:26:06 EDT Ventricular Rate:  109 PR Interval:    QRS Duration:  109 QT Interval:  333 QTC Calculation: 434 R Axis:   129  Text Interpretation: Undetermined rhythm Artifact Confirmed by Guadalupe Lee ([id number]) on 03/22/2024 10:35:14 AM  Radiology DG Chest Port 1 View Result Date: 03/22/2024 CLINICAL DATA:  Weakness tremors after fall. Pt fell and hit head yesterday states he stripped over a brick, but states tremors started an hour PTA. Denies  dizziness, SOB, CP, and visual changes. Pt AAOx4 and ambulatory. EXAM: PORTABLE CHEST 1 VIEW COMPARISON:  Chest x-ray 11/09/2016 FINDINGS: The heart and mediastinal contours are unchanged. Atherosclerotic plaque. No focal consolidation. No pulmonary edema. No pleural effusion. No pneumothorax. No acute osseous abnormality. IMPRESSION: 1.  No active disease. 2.  Aortic Atherosclerosis (ICD10-I70.0). Electronically Signed   By: Morgane  Naveau M.D.   On: 03/22/2024 11:11   CT Head Wo Contrast Result Date: 03/22/2024 CLINICAL DATA:  tremors, recent fall, blunt head injury; Ataxia, cervical trauma EXAM: CT HEAD WITHOUT CONTRAST CT CERVICAL SPINE WITHOUT CONTRAST TECHNIQUE: Multidetector CT imaging of the head and cervical spine was performed following the standard protocol without intravenous contrast. Multiplanar CT image reconstructions of the cervical spine were also generated. RADIATION DOSE REDUCTION: This exam was performed according to the departmental dose-optimization program which includes automated exposure control, adjustment of the mA and/or kV according to patient size and/or use of iterative reconstruction technique. COMPARISON:  CT head C-spine 11/09/2016 FINDINGS: CT HEAD FINDINGS Brain: Patchy and confluent areas of decreased attenuation are noted throughout the deep and periventricular white matter of the cerebral hemispheres bilaterally, compatible with chronic microvascular ischemic disease. Left temporal occipital lobe encephalomalacia. No evidence of large-territorial acute infarction. No parenchymal hemorrhage. No mass lesion. No extra-axial collection. No mass effect or midline shift. No hydrocephalus. Basilar cisterns are patent. Vascular: No hyperdense vessel. Atherosclerotic calcifications are present within the cavernous internal carotid arteries. Skull: No acute fracture or focal lesion. Sinuses/Orbits: Paranasal sinuses and mastoid air cells are clear. The orbits are unremarkable. Other:  None. CT CERVICAL SPINE FINDINGS Alignment: Normal. Skull base and vertebrae: Multilevel severe degenerative changes of the spine. Severe multilevel osseous neural foraminal stenosis. No severe osseous central canal stenosis. No acute fracture. No aggressive appearing focal osseous lesion or focal pathologic process. Soft tissues and spinal canal: No prevertebral fluid or swelling. No visible canal hematoma. Upper chest: Unremarkable. Other: Atherosclerotic plaque. IMPRESSION: 1. No acute intracranial abnormality. 2. No acute displaced fracture or traumatic listhesis of the cervical spine. 3. Multilevel severe degenerative changes of the spine. Severe multilevel osseous neural foraminal stenosis. Electronically Signed   By: Morgane  Naveau M.D.   On: 03/22/2024 11:07   CT Cervical Spine Wo Contrast Result Date: 03/22/2024 CLINICAL DATA:  tremors, recent fall, blunt head injury; Ataxia, cervical trauma EXAM: CT HEAD WITHOUT CONTRAST CT CERVICAL SPINE WITHOUT CONTRAST TECHNIQUE: Multidetector CT imaging of the head and cervical spine was performed following the standard protocol without intravenous contrast. Multiplanar CT image reconstructions of the cervical spine were also generated. RADIATION DOSE REDUCTION: This exam was performed according to the departmental dose-optimization program which includes automated exposure control, adjustment of the mA and/or kV according to patient size and/or use of iterative reconstruction technique. COMPARISON:  CT head C-spine 11/09/2016 FINDINGS: CT HEAD FINDINGS Brain: Patchy and confluent areas of decreased attenuation are noted throughout the deep and periventricular white matter of the cerebral hemispheres bilaterally, compatible with chronic microvascular ischemic disease. Left temporal occipital lobe encephalomalacia. No evidence of large-territorial acute infarction. No parenchymal hemorrhage. No mass lesion. No extra-axial collection. No mass effect or midline shift.  No hydrocephalus. Basilar cisterns are patent. Vascular: No hyperdense vessel. Atherosclerotic calcifications are present within the cavernous internal carotid arteries. Skull: No acute fracture or focal lesion. Sinuses/Orbits: Paranasal sinuses and mastoid air cells are clear. The orbits are unremarkable. Other: None. CT CERVICAL SPINE FINDINGS Alignment: Normal. Skull base and vertebrae: Multilevel severe degenerative changes of the spine. Severe multilevel osseous neural foraminal stenosis. No severe osseous central canal stenosis. No acute fracture. No aggressive appearing focal osseous lesion or focal pathologic process. Soft tissues and spinal canal: No prevertebral fluid or swelling. No visible canal hematoma. Upper chest: Unremarkable. Other: Atherosclerotic plaque. IMPRESSION: 1. No  acute intracranial abnormality. 2. No acute displaced fracture or traumatic listhesis of the cervical spine. 3. Multilevel severe degenerative changes of the spine. Severe multilevel osseous neural foraminal stenosis. Electronically Signed   By: Morgane  Naveau M.D.   On: 03/22/2024 11:07    Procedures Procedures    Medications Ordered in ED Medications  diltiazem (CARDIZEM) 1 mg/mL load via infusion 20 mg (20 mg Intravenous Bolus from Bag 03/22/24 1238)    And  diltiazem (CARDIZEM) 125 mg in dextrose 5% 125 mL (1 mg/mL) infusion (5 mg/hr Intravenous New Bag/Given 03/22/24 1238)  sodium chloride  0.9 % bolus 500 mL (500 mLs Intravenous New Bag/Given 03/22/24 1219)    ED Course/ Medical Decision Making/ A&P                                 Medical Decision Making Amount and/or Complexity of Data Reviewed Labs: ordered. Radiology: ordered.  Risk Prescription drug management.   This patient presents to the ED for concern of tremor differential diagnosis includes CVA, TIA, intracranial hemorrhage, electrolyte derangement, arrhythmia, space-occupying lesion of the brain    Additional history  obtained:  Additional history obtained from family External records from outside source obtained and reviewed including none   Lab Tests:  I Ordered, and personally interpreted labs.  The pertinent results include: Mild leukocytosis, no anemia, low platelets, normal electrolytes, normal creatinine, normal liver function, INR within normal limits for anticoagulation, normal magnesium, normal serial troponins, unremarkable urinalysis   Imaging Studies ordered:  I ordered imaging studies including CT scan of head, cervical spine, x-ray of chest I independently visualized and interpreted imaging which showed no acute intracranial hemorrhage, no vertebral fracture, no acute cardiopulmonary process I agree with the radiologist interpretation   Medicines ordered and prescription drug management:  I ordered medication including IV fluids, Cardizem for tremor, atrial flutter Reevaluation of the patient after these medicines showed that the patient improved I have reviewed the patients home medicines and have made adjustments as needed   Problem List / ED Course:  Patient is doing much better at this time and is stable for discharge home.  Tremors have resolved in the emergency department.  Suspect the symptoms may have been secondary to atrial flutter with rapid rate.  Current heart rate is 95 at the bedside at this point.  Patient will follow-up closely with his primary care doctor as well as with cardiology on an outpatient basis.  Blood work has been otherwise unremarkable.  He has no indication for acute kidney injury or electrolyte changes.  Serial troponins have been within normal limits.  EKG demonstrated no signs of acute ischemic changes.  CT scan of the head, cervical spine and chest x-ray were unremarkable as well.  Patient has no concerning neurological deficits on exam.  Do not suspect CVA or TIA.  Strict turn precautions were provided for any new or worsening symptoms.  Patient and  wife voiced understanding and had no additional questions.  Patient was fully evaluated by attending physician who is in agreement to plan at this time.   Social Determinants of Health:  None           Final Clinical Impression(s) / ED Diagnoses Final diagnoses:  Tremor  Typical atrial flutter (HCC)    Rx / DC Orders ED Discharge Orders          Ordered    Ambulatory referral to Cardiology  Comments: If you have not heard from the Cardiology office within the next 72 hours please call [phone].   03/22/24 1405              Roselynn Connors, PA-C 03/22/24 1412    Guadalupe Lee, MD 03/23/24 [phone]

## 2024-03-24 DIAGNOSIS — L89892 Pressure ulcer of other site, stage 2: Secondary | ICD-10-CM | POA: Diagnosis not present

## 2024-03-24 DIAGNOSIS — E1140 Type 2 diabetes mellitus with diabetic neuropathy, unspecified: Secondary | ICD-10-CM | POA: Diagnosis not present

## 2024-03-24 DIAGNOSIS — M79672 Pain in left foot: Secondary | ICD-10-CM | POA: Diagnosis not present

## 2024-03-24 DIAGNOSIS — M79675 Pain in left toe(s): Secondary | ICD-10-CM | POA: Diagnosis not present

## 2024-03-24 DIAGNOSIS — L110 Acquired keratosis follicularis: Secondary | ICD-10-CM | POA: Diagnosis not present

## 2024-04-10 DIAGNOSIS — L110 Acquired keratosis follicularis: Secondary | ICD-10-CM | POA: Diagnosis not present

## 2024-04-10 DIAGNOSIS — M79672 Pain in left foot: Secondary | ICD-10-CM | POA: Diagnosis not present

## 2024-04-10 DIAGNOSIS — L89892 Pressure ulcer of other site, stage 2: Secondary | ICD-10-CM | POA: Diagnosis not present

## 2024-04-10 DIAGNOSIS — M79675 Pain in left toe(s): Secondary | ICD-10-CM | POA: Diagnosis not present

## 2024-04-10 DIAGNOSIS — E1140 Type 2 diabetes mellitus with diabetic neuropathy, unspecified: Secondary | ICD-10-CM | POA: Diagnosis not present

## 2024-04-17 DIAGNOSIS — K219 Gastro-esophageal reflux disease without esophagitis: Secondary | ICD-10-CM | POA: Diagnosis not present

## 2024-04-17 DIAGNOSIS — Z299 Encounter for prophylactic measures, unspecified: Secondary | ICD-10-CM | POA: Diagnosis not present

## 2024-04-17 DIAGNOSIS — I4891 Unspecified atrial fibrillation: Secondary | ICD-10-CM | POA: Diagnosis not present

## 2024-04-17 DIAGNOSIS — I10 Essential (primary) hypertension: Secondary | ICD-10-CM | POA: Diagnosis not present

## 2024-05-02 ENCOUNTER — Ambulatory Visit: Attending: Cardiology | Admitting: Cardiology

## 2024-05-02 NOTE — Progress Notes (Deleted)
 Cardiology Office Note  Date: 05/02/2024   ID: LIOR HOEN, DOB Oct 06, 1941, MRN [id number]  PCP: Rosamond Leta NOVAK, MD  Chief Complaint: No chief complaint on file.  History of Present Illness: Nicholas Berry is an 83 y.o. male last assessed via telehealth encounter in April 2021.  He presents to reestablish regular follow-up.  Records indicate ER visit in June at [facility] for evaluation of a feeling of tremulousness.  Workup excluded ACS, neuroimaging did not indicate any acute findings, chest x-ray reported no acute process, and his ECG showed atypical atrial flutter with variable AV block.  He has a prior documented history of permanent atrial fibrillation.   Review of Systems: As outlined in the history of present illness.  Past Medical History: Past Medical History:  Diagnosis Date   Anxiety    Colon polyps    Essential hypertension    GERD (gastroesophageal reflux disease)    History of gout    Hyperlipidemia    Paroxysmal atrial fibrillation (HCC)    Prostate cancer (HCC)    Spinal stenosis    Stroke (HCC)    1994   Past Surgical History: Past Surgical History:  Procedure Laterality Date   BILATERAL DECOMPRESSIVE LAMINECTOMIES & FORAMINOTOMIES L4-L5     BILATERAL TOTAL HIP REPLACEMENTS     CATARACT EXTRACTION W/PHACO Right 08/27/2017   Procedure: CATARACT EXTRACTION PHACO AND INTRAOCULAR LENS PLACEMENT (IOC);  Surgeon: Perley Hamilton, MD;  Location: [facility];  Service: Ophthalmology;  Laterality: Right;  CDE: 10.52   CATARACT EXTRACTION W/PHACO Left 10/29/2017   Procedure: CATARACT EXTRACTION PHACO AND INTRAOCULAR LENS PLACEMENT (IOC);  Surgeon: Perley Hamilton, MD;  Location: [facility]S;  Service: Ophthalmology;  Laterality: Left;  CDE: 9.84   COLONOSCOPY     COLONOSCOPY N/A 09/15/2015   Procedure: COLONOSCOPY;  Surgeon: Claudis RAYMOND Rivet, MD;  Location: [facility] SUITE;  Service: Endoscopy;  Laterality: N/A;  1030   ENDOVENOUS ABLATION SAPHENOUS VEIN W/ LASER Left 01/08/2020    endovenous laser ablation left greater saphenous vein and stab phlebectomy 10-20 incisions left leg by Medford Blade MD    ENDOVENOUS ABLATION SAPHENOUS VEIN W/ LASER Right 03/04/2020   endovenous laser ablation right greater saphenous vein and stab phlebectomy> 20 incisions right leg by Medford Blade MD    PROSTATE SURGERY  2007   cancer-robotic   Family History: Family History  Problem Relation Age of Onset   Heart attack Brother 73       CABG   Heart disease Mother    Heart disease Father    Social History:  Social History   Tobacco Use   Smoking status: Never   Smokeless tobacco: Never  Substance Use Topics   Alcohol use: Yes    Alcohol/week: 14.0 standard drinks of alcohol    Types: 14 Cans of beer per week   Medications: Current Outpatient Medications on File Prior to Visit  Medication Sig Dispense Refill   atorvastatin  (LIPITOR) 20 MG tablet Take 20 mg by mouth at bedtime.      diltiazem  (CARDIZEM  CD) 240 MG 24 hr capsule Take 240 mg by mouth daily.       enalapril (VASOTEC) 20 MG tablet Take 20 mg by mouth daily.      Multiple Vitamin (MULTIVITAMIN) tablet Take 1 tablet by mouth daily.     Omega-3 Fatty Acids (FISH OIL) 1200 MG CAPS Take 1,200 mg by mouth 2 (two) times daily.      omeprazole (PRILOSEC) 20 MG capsule  Take 20 mg by mouth daily.     pregabalin (LYRICA) 150 MG capsule Take 150 mg by mouth 2 (two) times daily.     solifenacin  (VESICARE ) 5 MG tablet Take 1 tablet (5 mg total) by mouth daily. 30 tablet 11   tadalafil  (CIALIS ) 20 MG tablet Take 1 tablet (20 mg total) by mouth as needed. 30 tablet 5   warfarin (COUMADIN) 2.5 MG tablet Take 2.5-5 mg by mouth See admin instructions. Take 2.5 mg by mouth daily in morning.     zolpidem (AMBIEN) 10 MG tablet Take 10 mg by mouth at bedtime.      No current facility-administered medications on file prior to visit.   Allergies: No Known Allergies Physical Exam: VS:  There were no vitals taken for this visit.,  BMI There is no height or weight on file to calculate BMI.  Wt Readings from Last 3 Encounters:  03/22/24 193 lb (87.5 kg)  06/22/22 188 lb (85.3 kg)  03/11/20 189 lb (85.7 kg)    General: Patient appears comfortable at rest. HEENT: Conjunctiva and lids normal, oropharynx clear with moist mucosa. Neck: Supple, no elevated JVP or carotid bruits, no thyromegaly. Lungs: Clear to auscultation, nonlabored breathing at rest. Cardiac: Regular rate and rhythm, no S3 or significant systolic murmur, no pericardial rub. Abdomen: Soft, nontender, no hepatomegaly, bowel sounds present, no guarding or rebound. Extremities: No pitting edema, distal pulses 2+. Skin: Warm and dry. Musculoskeletal: No kyphosis. Neuropsychiatric: Alert and oriented x3, affect grossly appropriate.  ECG:  An ECG dated 03/22/2024 was personally reviewed today and demonstrated:  Atypical atrial flutter with variable block and rightward axis.  Labwork: 03/22/2024: ALT 19; AST 21; BUN 17; Creatinine, Ser 0.94; Hemoglobin 16.0; Magnesium 1.7; Platelets 136; Potassium 4.6; Sodium 139; TSH 1.026   Other Studies Reviewed Today:  No interval cardiac testing for review today.  Assessment and Plan:  1.  Permanent atrial fibrillation, CHA2DS2-VASc score is 5.   2.  Primary hypertension.  Disposition:  Follow up {follow up:15908}  Signed, Jayson JUDITHANN Sierras, M.D., F.A.C.C. [facility]

## 2024-05-07 ENCOUNTER — Encounter: Payer: Self-pay | Admitting: Cardiology

## 2024-05-07 ENCOUNTER — Ambulatory Visit: Attending: Cardiology | Admitting: Cardiology

## 2024-05-07 VITALS — BP 138/82 | HR 96 | Ht 71.0 in | Wt 197.4 lb

## 2024-05-07 DIAGNOSIS — I4821 Permanent atrial fibrillation: Secondary | ICD-10-CM

## 2024-05-07 DIAGNOSIS — I484 Atypical atrial flutter: Secondary | ICD-10-CM

## 2024-05-07 DIAGNOSIS — I10 Essential (primary) hypertension: Secondary | ICD-10-CM

## 2024-05-07 NOTE — Progress Notes (Signed)
 Cardiology Office Note  Date: 05/07/2024   ID: Sevag, Shearn 1940-12-24, MRN [id number]  PCP: Rosamond Leta NOVAK, MD  Chief Complaint:  Chief Complaint  Patient presents with   Cardiac follow-up   History of Present Illness: [place] ANTOLIN is an 83 y.o. male last assessed via telehealth encounter in April 2021.  He presents to reestablish regular follow-up.  Records indicate ER visit in June at [facility] for evaluation of a feeling of tremulousness.  Workup excluded ACS, neuroimaging did not indicate any acute findings, chest x-ray reported no acute process, and his ECG showed atypical atrial flutter with variable AV block.  He has a prior documented history of permanent atrial fibrillation.  He is here today for follow-up reporting no sense of palpitations or chest discomfort, no unusual shortness of breath.  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  We went over his medications.  He is on Coumadin for stroke prophylaxis, followed by Dr. Rosamond.  Also remains on Cardizem  CD 240 mg daily.  I rechecked his heart rate after being seated for several minutes at 80 bpm.  Review of Systems: As outlined in the history of present illness.  States that on the morning that he went in for ER evaluation he was having a lot of hiccups.  This has not been a recurring problem since.  No dizziness or syncope.  Past Medical History: Past Medical History:  Diagnosis Date   Anxiety    Colon polyps    Essential hypertension    GERD (gastroesophageal reflux disease)    History of gout    Hyperlipidemia    Paroxysmal atrial fibrillation (HCC)    Prostate cancer (HCC)    Spinal stenosis    Stroke (HCC)    1994   Past Surgical History: Past Surgical History:  Procedure Laterality Date   BILATERAL DECOMPRESSIVE LAMINECTOMIES & FORAMINOTOMIES L4-L5     BILATERAL TOTAL HIP REPLACEMENTS     CATARACT EXTRACTION W/PHACO Right 08/27/2017   Procedure: CATARACT EXTRACTION  PHACO AND INTRAOCULAR LENS PLACEMENT (IOC);  Surgeon: Perley Hamilton, MD;  Location: [facility];  Service: Ophthalmology;  Laterality: Right;  CDE: 10.52   CATARACT EXTRACTION W/PHACO Left 10/29/2017   Procedure: CATARACT EXTRACTION PHACO AND INTRAOCULAR LENS PLACEMENT (IOC);  Surgeon: Perley Hamilton, MD;  Location: [facility];  Service: Ophthalmology;  Laterality: Left;  CDE: 9.84   COLONOSCOPY     COLONOSCOPY N/A 09/15/2015   Procedure: COLONOSCOPY;  Surgeon: Claudis RAYMOND Rivet, MD;  Location: [facility];  Service: Endoscopy;  Laterality: N/A;  1030   ENDOVENOUS ABLATION SAPHENOUS VEIN W/ LASER Left 01/08/2020   endovenous laser ablation left greater saphenous vein and stab phlebectomy 10-20 incisions left leg by Medford Blade MD    ENDOVENOUS ABLATION SAPHENOUS VEIN W/ LASER Right 03/04/2020   endovenous laser ablation right greater saphenous vein and stab phlebectomy> 20 incisions right leg by Medford Blade MD    PROSTATE SURGERY  2007   cancer-robotic   Family History: Family History  Problem Relation Age of Onset   Heart attack Brother 73       CABG   Heart disease Mother    Heart disease Father    Social History:  Social History   Tobacco Use   Smoking status: Never   Smokeless tobacco: Never  Substance Use Topics   Alcohol use: Yes    Alcohol/week: 14.0 standard drinks of alcohol    Types: 14 Cans of  beer per week   Medications: Current Outpatient Medications on File Prior to Visit  Medication Sig Dispense Refill   atorvastatin  (LIPITOR) 20 MG tablet Take 20 mg by mouth at bedtime.      diltiazem  (CARDIZEM  CD) 240 MG 24 hr capsule Take 240 mg by mouth daily.       enalapril (VASOTEC) 20 MG tablet Take 20 mg by mouth daily.      Multiple Vitamin (MULTIVITAMIN) tablet Take 1 tablet by mouth daily.     Omega-3 Fatty Acids (FISH OIL) 1200 MG CAPS Take 1,200 mg by mouth 2 (two) times daily.      pantoprazole (PROTONIX) 40 MG tablet Take 40 mg by mouth daily.     pregabalin (LYRICA) 150  MG capsule Take 150 mg by mouth 2 (two) times daily.     solifenacin  (VESICARE ) 5 MG tablet Take 1 tablet (5 mg total) by mouth daily. 30 tablet 11   tadalafil  (CIALIS ) 20 MG tablet Take 1 tablet (20 mg total) by mouth as needed. 30 tablet 5   warfarin (COUMADIN) 2.5 MG tablet Take 2.5-5 mg by mouth See admin instructions. Take 2.5 mg by mouth daily in morning.     zolpidem (AMBIEN) 10 MG tablet Take 10 mg by mouth at bedtime.      omeprazole (PRILOSEC) 20 MG capsule Take 20 mg by mouth daily. (Patient not taking: Reported on 05/07/2024)     No current facility-administered medications on file prior to visit.   Allergies: No Known Allergies Physical Exam: VS:  BP 138/82 (BP Location: Left Arm)   Pulse 96   Ht 5' 11 (1.803 m)   Wt 197 lb 6.4 oz (89.5 kg)   SpO2 96%   BMI 27.53 kg/m , BMI Body mass index is 27.53 kg/m.  Wt Readings from Last 3 Encounters:  05/07/24 197 lb 6.4 oz (89.5 kg)  03/22/24 193 lb (87.5 kg)  06/22/22 188 lb (85.3 kg)    General: Patient appears comfortable at rest. HEENT: Conjunctiva and lids normal. Neck: Supple, no elevated JVP or carotid bruits. Lungs: Clear to auscultation, nonlabored breathing at rest. Cardiac: Irregularly irregular, no significant murmur or gallop. Abdomen: Soft, bowel sounds present. Extremities: No pitting edema, distal pulses 2+. Skin: Warm and dry. Musculoskeletal: No kyphosis. Neuropsychiatric: Alert and oriented x3, affect grossly appropriate.  ECG:  An ECG dated 03/22/2024 was personally reviewed today and demonstrated:  Atypical atrial flutter with variable block and rightward axis.  Labwork: 03/22/2024: ALT 19; AST 21; BUN 17; Creatinine, Ser 0.94; Hemoglobin 16.0; Magnesium 1.7; Platelets 136; Potassium 4.6; Sodium 139; TSH 1.026   Other Studies Reviewed Today:  No interval cardiac testing for review today.  Assessment and Plan:  1.  Permanent atrial fibrillation and recently documented atypical atrial flutter with  variable block, CHA2DS2-VASc score is 5.  He is not reporting any significant sense of palpitations on a regular basis, heart rate in the 80s at rest on recheck today.  He is on Coumadin for stroke prophylaxis with follow-up by his PCP.  Does not report any spontaneous bleeding problems.  Continue Cardizem  CD 240 mg daily for now.   2.  Primary hypertension.  Blood pressure is reasonable today.  Continue Vasotec 20 mg daily.  Disposition:  Follow up 6 months.  Signed, Jayson JUDITHANN Sierras, M.D., F.A.C.C. [facility]

## 2024-05-07 NOTE — Patient Instructions (Addendum)

## 2024-05-12 ENCOUNTER — Ambulatory Visit: Admitting: Urology

## 2024-05-12 ENCOUNTER — Encounter: Payer: Self-pay | Admitting: Urology

## 2024-05-12 VITALS — BP 125/74 | HR 79

## 2024-05-12 DIAGNOSIS — I739 Peripheral vascular disease, unspecified: Secondary | ICD-10-CM | POA: Diagnosis not present

## 2024-05-12 DIAGNOSIS — R32 Unspecified urinary incontinence: Secondary | ICD-10-CM | POA: Diagnosis not present

## 2024-05-12 DIAGNOSIS — L110 Acquired keratosis follicularis: Secondary | ICD-10-CM | POA: Diagnosis not present

## 2024-05-12 DIAGNOSIS — M79675 Pain in left toe(s): Secondary | ICD-10-CM | POA: Diagnosis not present

## 2024-05-12 DIAGNOSIS — E1140 Type 2 diabetes mellitus with diabetic neuropathy, unspecified: Secondary | ICD-10-CM | POA: Diagnosis not present

## 2024-05-12 DIAGNOSIS — M79671 Pain in right foot: Secondary | ICD-10-CM | POA: Diagnosis not present

## 2024-05-12 DIAGNOSIS — N5231 Erectile dysfunction following radical prostatectomy: Secondary | ICD-10-CM

## 2024-05-12 DIAGNOSIS — M79674 Pain in right toe(s): Secondary | ICD-10-CM | POA: Diagnosis not present

## 2024-05-12 DIAGNOSIS — M79672 Pain in left foot: Secondary | ICD-10-CM | POA: Diagnosis not present

## 2024-05-12 MED ORDER — SOLIFENACIN SUCCINATE 10 MG PO TABS: 10.0000 mg | ORAL_TABLET | Freq: Every day | ORAL | 11 refills | Status: DC

## 2024-05-12 MED ORDER — TADALAFIL 20 MG PO TABS: 20.0000 mg | ORAL_TABLET | ORAL | 5 refills | Status: AC | PRN

## 2024-05-12 NOTE — Progress Notes (Signed)
 05/12/2024 3:21 PM   Nicholas Berry 07/18/1941 [id number]  Referring provider: Rosamond Leta NOVAK, MD [address] [place],  [place] [address]  Urge incontinence   HPI: Nicholas Berry is a 82yo here for followup for urge incontinence and erectile dysfunction.  He notes improvement in his urge incontinence since going to vesicare  5mg  in the AM. He uses 1 pad during the day and 1 at night.  He uses tadalafil  20mg  and a VED with mixed results.    PMH: Past Medical History:  Diagnosis Date   Anxiety    Colon polyps    Essential hypertension    GERD (gastroesophageal reflux disease)    History of gout    Hyperlipidemia    Paroxysmal atrial fibrillation (HCC)    Prostate cancer (HCC)    Spinal stenosis    Stroke [facility])    1994    Surgical History: Past Surgical History:  Procedure Laterality Date   BILATERAL DECOMPRESSIVE LAMINECTOMIES & FORAMINOTOMIES L4-L5     BILATERAL TOTAL HIP REPLACEMENTS     CATARACT EXTRACTION W/PHACO Right 08/27/2017   Procedure: CATARACT EXTRACTION PHACO AND INTRAOCULAR LENS PLACEMENT (IOC);  Surgeon: Perley Hamilton, MD;  Location: [facility];  Service: Ophthalmology;  Laterality: Right;  CDE: 10.52   CATARACT EXTRACTION W/PHACO Left 10/29/2017   Procedure: CATARACT EXTRACTION PHACO AND INTRAOCULAR LENS PLACEMENT (IOC);  Surgeon: Perley Hamilton, MD;  Location: [facility];  Service: Ophthalmology;  Laterality: Left;  CDE: 9.84   COLONOSCOPY     COLONOSCOPY N/A 09/15/2015   Procedure: COLONOSCOPY;  Surgeon: Claudis RAYMOND Rivet, MD;  [facility];  Service: Endoscopy;  Laterality: N/A;  1030   ENDOVENOUS ABLATION SAPHENOUS VEIN W/ LASER Left 01/08/2020   endovenous laser ablation left greater saphenous vein and stab phlebectomy 10-20 incisions left leg by Medford Blade MD    ENDOVENOUS ABLATION SAPHENOUS VEIN W/ LASER Right 03/04/2020   endovenous laser ablation right greater saphenous vein and stab phlebectomy> 20 incisions right leg by Medford Blade MD    PROSTATE  SURGERY  2007   cancer-robotic    Home Medications:  Allergies as of 05/12/2024   No Known Allergies      Medication List        Accurate as of May 12, 2024  3:21 PM. If you have any questions, ask your nurse or doctor.          atorvastatin  20 MG tablet Commonly known as: LIPITOR Take 20 mg by mouth at bedtime.   diltiazem  240 MG 24 hr capsule Commonly known as: CARDIZEM  CD Take 240 mg by mouth daily.   enalapril 20 MG tablet Commonly known as: VASOTEC Take 20 mg by mouth daily.   Fish Oil 1200 MG Caps Take 1,200 mg by mouth 2 (two) times daily.   multivitamin tablet Take 1 tablet by mouth daily.   omeprazole 20 MG capsule Commonly known as: PRILOSEC Take 20 mg by mouth daily.   pantoprazole 40 MG tablet Commonly known as: PROTONIX Take 40 mg by mouth daily.   pregabalin 150 MG capsule Commonly known as: LYRICA Take 150 mg by mouth 2 (two) times daily.   solifenacin  5 MG tablet Commonly known as: VESICARE  Take 1 tablet (5 mg total) by mouth daily.   tadalafil  20 MG tablet Commonly known as: CIALIS  Take 1 tablet (20 mg total) by mouth as needed.   warfarin 2.5 MG tablet Commonly known as: COUMADIN Take 2.5-5 mg by mouth See admin instructions. Take 2.5  mg by mouth daily in morning.   zolpidem 10 MG tablet Commonly known as: AMBIEN Take 10 mg by mouth at bedtime.        Allergies: No Known Allergies  Family History: Family History  Problem Relation Age of Onset   Heart attack Brother 39       CABG   Heart disease Mother    Heart disease Father     Social History:  reports that he has never smoked. He has never used smokeless tobacco. He reports current alcohol use of about 14.0 standard drinks of alcohol per week. He reports that he does not use drugs.  ROS: All other review of systems were reviewed and are negative except what is noted above in HPI  Physical Exam: BP 125/74   Pulse 79   Constitutional:  Alert and oriented, No  acute distress. HEENT: [place] AT, moist mucus membranes.  Trachea midline, no masses. Cardiovascular: No clubbing, cyanosis, or edema. Respiratory: Normal respiratory effort, no increased work of breathing. GI: Abdomen is soft, nontender, nondistended, no abdominal masses GU: No CVA tenderness.  Lymph: No cervical or inguinal lymphadenopathy. Skin: No rashes, bruises or suspicious lesions. Neurologic: Grossly intact, no focal deficits, moving all 4 extremities. Psychiatric: Normal mood and affect.  Laboratory Data: Lab Results  Component Value Date   WBC 11.4 (H) 03/22/2024   HGB 16.0 03/22/2024   HCT 48.3 03/22/2024   MCV 85.3 03/22/2024   PLT 136 (L) 03/22/2024    Lab Results  Component Value Date   CREATININE 0.94 03/22/2024    No results found for: PSA  No results found for: TESTOSTERONE  No results found for: HGBA1C  Urinalysis    Component Value Date/Time   COLORURINE YELLOW 03/22/2024 1340   APPEARANCEUR CLEAR 03/22/2024 1340   APPEARANCEUR Clear 02/06/2024 0809   LABSPEC 1.011 03/22/2024 1340   PHURINE 7.0 03/22/2024 1340   GLUCOSEU NEGATIVE 03/22/2024 1340   HGBUR NEGATIVE 03/22/2024 1340   BILIRUBINUR NEGATIVE 03/22/2024 1340   BILIRUBINUR Negative 02/06/2024 0809   KETONESUR NEGATIVE 03/22/2024 1340   PROTEINUR NEGATIVE 03/22/2024 1340   NITRITE NEGATIVE 03/22/2024 1340   LEUKOCYTESUR NEGATIVE 03/22/2024 1340    Lab Results  Component Value Date   LABMICR Comment 02/06/2024   WBCUA 6-10 (A) 06/22/2022   LABEPIT 0-10 06/22/2022   BACTERIA Few 06/22/2022    Pertinent Imaging:  No results found for this or any previous visit.  No results found for this or any previous visit.  No results found for this or any previous visit.  No results found for this or any previous visit.  No results found for this or any previous visit.  No results found for this or any previous visit.  No results found for this or any previous visit.  No results  found for this or any previous visit.   Assessment & Plan:    1. Urinary incontinence, unspecified type (Primary) -increase vesicare  to 10mg  daily - Urinalysis, Routine w reflex microscopic  2. Erectile dysfunction after radical prostatectomy Continue tadalafil  20mg  prn and VED   No follow-ups on file.  Belvie Clara, MD  [facility] [place]

## 2024-05-12 NOTE — Patient Instructions (Signed)

## 2024-05-13 LAB — URINALYSIS, ROUTINE W REFLEX MICROSCOPIC
Bilirubin, UA: NEGATIVE
Glucose, UA: NEGATIVE
Ketones, UA: NEGATIVE
Leukocytes,UA: NEGATIVE
Nitrite, UA: NEGATIVE
Protein,UA: NEGATIVE
RBC, UA: NEGATIVE
Specific Gravity, UA: 1.01 (ref 1.005–1.030)
Urobilinogen, Ur: 0.2 mg/dL (ref 0.2–1.0)
pH, UA: 6 (ref 5.0–7.5)

## 2024-05-15 DIAGNOSIS — Z0000 Encounter for general adult medical examination without abnormal findings: Secondary | ICD-10-CM | POA: Diagnosis not present

## 2024-05-15 DIAGNOSIS — I739 Peripheral vascular disease, unspecified: Secondary | ICD-10-CM | POA: Diagnosis not present

## 2024-05-15 DIAGNOSIS — R5383 Other fatigue: Secondary | ICD-10-CM | POA: Diagnosis not present

## 2024-05-15 DIAGNOSIS — Z79899 Other long term (current) drug therapy: Secondary | ICD-10-CM | POA: Diagnosis not present

## 2024-05-15 DIAGNOSIS — E7800 Pure hypercholesterolemia, unspecified: Secondary | ICD-10-CM | POA: Diagnosis not present

## 2024-05-15 DIAGNOSIS — Z299 Encounter for prophylactic measures, unspecified: Secondary | ICD-10-CM | POA: Diagnosis not present

## 2024-05-15 DIAGNOSIS — I10 Essential (primary) hypertension: Secondary | ICD-10-CM | POA: Diagnosis not present

## 2024-05-15 DIAGNOSIS — I4891 Unspecified atrial fibrillation: Secondary | ICD-10-CM | POA: Diagnosis not present

## 2024-06-12 DIAGNOSIS — Z299 Encounter for prophylactic measures, unspecified: Secondary | ICD-10-CM | POA: Diagnosis not present

## 2024-06-12 DIAGNOSIS — I10 Essential (primary) hypertension: Secondary | ICD-10-CM | POA: Diagnosis not present

## 2024-06-12 DIAGNOSIS — I4891 Unspecified atrial fibrillation: Secondary | ICD-10-CM | POA: Diagnosis not present

## 2024-06-12 DIAGNOSIS — E7800 Pure hypercholesterolemia, unspecified: Secondary | ICD-10-CM | POA: Diagnosis not present

## 2024-07-10 DIAGNOSIS — I10 Essential (primary) hypertension: Secondary | ICD-10-CM | POA: Diagnosis not present

## 2024-07-10 DIAGNOSIS — E7800 Pure hypercholesterolemia, unspecified: Secondary | ICD-10-CM | POA: Diagnosis not present

## 2024-07-10 DIAGNOSIS — I4891 Unspecified atrial fibrillation: Secondary | ICD-10-CM | POA: Diagnosis not present

## 2024-07-10 DIAGNOSIS — Z299 Encounter for prophylactic measures, unspecified: Secondary | ICD-10-CM | POA: Diagnosis not present

## 2024-07-21 DIAGNOSIS — M79672 Pain in left foot: Secondary | ICD-10-CM | POA: Diagnosis not present

## 2024-07-21 DIAGNOSIS — L110 Acquired keratosis follicularis: Secondary | ICD-10-CM | POA: Diagnosis not present

## 2024-07-21 DIAGNOSIS — M79674 Pain in right toe(s): Secondary | ICD-10-CM | POA: Diagnosis not present

## 2024-07-21 DIAGNOSIS — E1140 Type 2 diabetes mellitus with diabetic neuropathy, unspecified: Secondary | ICD-10-CM | POA: Diagnosis not present

## 2024-07-21 DIAGNOSIS — M79675 Pain in left toe(s): Secondary | ICD-10-CM | POA: Diagnosis not present

## 2024-07-21 DIAGNOSIS — I739 Peripheral vascular disease, unspecified: Secondary | ICD-10-CM | POA: Diagnosis not present

## 2024-07-21 DIAGNOSIS — M79671 Pain in right foot: Secondary | ICD-10-CM | POA: Diagnosis not present

## 2024-07-30 ENCOUNTER — Telehealth: Payer: Self-pay | Admitting: Urology

## 2024-07-30 NOTE — Telephone Encounter (Signed)
 Return call to pt, pt state's he had prostate removed and he is having OAB symptoms. Pt state's that the Vesicare  is not helping and causing him to void more. Pt state's he was only taking 5 mg and was unaware MD, McKenzie increased his Vesicare  to 10 mg daily. Pt is made aware to take 10 mg Vesicare . Pt state's he will take medication and see if this help with OAB symptoms. Pt voiced understanding

## 2024-07-30 NOTE — Telephone Encounter (Signed)
 Wants to ask the nurse something but did not want to explain [phone]

## 2024-09-03 ENCOUNTER — Telehealth: Payer: Self-pay

## 2024-09-03 DIAGNOSIS — R32 Unspecified urinary incontinence: Secondary | ICD-10-CM

## 2024-09-03 NOTE — Telephone Encounter (Signed)
 Patient filled out a Walk-in Encounter form 09-03-2024.  Taking solifenacin  (VESICARE ) 10 MG tablet .  Is going to the bathroom more.  Needing something to help slow frequency.  Pharmacy:  [facility] - [place], [place] - [address] Phone: [phone]  Fax: [phone]     Please advise.  Call:  [phone]

## 2024-09-09 MED ORDER — TROSPIUM CHLORIDE 20 MG PO TABS: 20.0000 mg | ORAL_TABLET | Freq: Two times a day (BID) | ORAL | 11 refills | Status: AC

## 2024-09-09 NOTE — Telephone Encounter (Signed)
 Please send in mirabgron 50mg  not formulary, verbal from Dr. Sherrilee send in Trospium  20 mg BID which is formulary. Pt is made aware and voiced understanding

## 2024-11-12 ENCOUNTER — Ambulatory Visit: Admitting: Urology

## 2024-11-12 DIAGNOSIS — R32 Unspecified urinary incontinence: Secondary | ICD-10-CM

## 2024-11-14 ENCOUNTER — Telehealth: Payer: Self-pay

## 2024-11-14 NOTE — Telephone Encounter (Signed)
 Patient has concern for overnight incontinence for 2 or 3 months.    Please advise.  Call:  [phone]
